# Patient Record
Sex: Female | Born: 1980 | Race: White | Hispanic: No | Marital: Single | Smoking: Former smoker
Health system: Southern US, Community
[De-identification: ages and names within clinical notes are randomized; demographics above are authoritative.]

## PROBLEM LIST (undated history)

## (undated) DIAGNOSIS — G43909 Migraine, unspecified, not intractable, without status migrainosus: Secondary | ICD-10-CM

## (undated) DIAGNOSIS — B009 Herpesviral infection, unspecified: Secondary | ICD-10-CM

## (undated) HISTORY — PX: OTHER SURGICAL HISTORY: SHX169

## (undated) HISTORY — PX: TUBAL LIGATION: SHX77

---

## 2004-05-05 ENCOUNTER — Emergency Department: Payer: Self-pay | Admitting: Internal Medicine

## 2005-11-13 ENCOUNTER — Ambulatory Visit: Payer: Self-pay | Admitting: Obstetrics & Gynecology

## 2005-12-22 ENCOUNTER — Emergency Department: Payer: Self-pay | Admitting: Emergency Medicine

## 2017-08-30 ENCOUNTER — Emergency Department
Admission: EM | Admit: 2017-08-30 | Discharge: 2017-08-30 | Disposition: A | Discharge status: Home / Self Care | Payer: Self-pay | Attending: Emergency Medicine | Admitting: Emergency Medicine

## 2017-08-30 ENCOUNTER — Encounter: Payer: Self-pay | Admitting: Medical Oncology

## 2017-08-30 ENCOUNTER — Emergency Department: Payer: Self-pay

## 2017-08-30 DIAGNOSIS — J069 Acute upper respiratory infection, unspecified: Secondary | ICD-10-CM | POA: Insufficient documentation

## 2017-08-30 LAB — BASIC METABOLIC PANEL
Anion gap: 7 (ref 5–15)
BUN: 15 mg/dL (ref 6–20)
CALCIUM: 8.8 mg/dL — AB (ref 8.9–10.3)
CO2: 25 mmol/L (ref 22–32)
CREATININE: 0.86 mg/dL (ref 0.44–1.00)
Chloride: 103 mmol/L (ref 101–111)
GFR calc non Af Amer: >60 mL/min (ref >60)
GLUCOSE: 98 mg/dL (ref 65–99)
Potassium: 3.3 mmol/L — ABNORMAL LOW (ref 3.5–5.1)
Sodium: 135 mmol/L (ref 135–145)

## 2017-08-30 LAB — CBC WITH DIFFERENTIAL/PLATELET
BASOS ABS: 0 10*3/uL (ref 0–0.1)
BASOS PCT: 1 %
EOS PCT: 1 %
Eosinophils Absolute: 0 10*3/uL (ref 0–0.7)
HCT: 47.5 % — ABNORMAL HIGH (ref 35.0–47.0)
Hemoglobin: 15.9 g/dL (ref 12.0–16.0)
Lymphocytes Relative: 11 %
Lymphs Abs: 0.5 10*3/uL — ABNORMAL LOW (ref 1.0–3.6)
MCH: 30.6 pg (ref 26.0–34.0)
MCHC: 33.5 g/dL (ref 32.0–36.0)
MCV: 91.4 fL (ref 80.0–100.0)
MONO ABS: 0.5 10*3/uL (ref 0.2–0.9)
MONOS PCT: 10 %
Neutro Abs: 3.7 10*3/uL (ref 1.4–6.5)
Neutrophils Relative %: 77 %
PLATELETS: 148 10*3/uL — AB (ref 150–440)
RBC: 5.19 MIL/uL (ref 3.80–5.20)
RDW: 13.1 % (ref 11.5–14.5)
WBC: 4.8 10*3/uL (ref 3.6–11.0)

## 2017-08-30 LAB — LACTIC ACID, PLASMA: Lactic Acid, Venous: 0.8 mmol/L (ref 0.5–1.9)

## 2017-08-30 MED ORDER — ALBUTEROL SULFATE HFA 108 (90 BASE) MCG/ACT IN AERS: 2.0000 | INHALATION_SPRAY | Freq: Four times a day (QID) | RESPIRATORY_TRACT | 0 refills | Status: DC | PRN

## 2017-08-30 MED ORDER — IPRATROPIUM-ALBUTEROL 0.5-2.5 (3) MG/3ML IN SOLN
3.0000 mL | Freq: Once | RESPIRATORY_TRACT | Status: AC
  Administered 2017-08-30: 3 mL via RESPIRATORY_TRACT
  Filled 2017-08-30: qty 3

## 2017-08-30 NOTE — ED Triage Notes (Signed)
First Nurse Note:  States she choked on a piece of ice on Thursday and since that time she has been coughing and running a fever.  Concerned she has pneumonia.    Patient is AAOx3.  Skin warm and dry. No SOB/DOE.  Voice clear and strong.  NAD

## 2017-08-30 NOTE — ED Notes (Signed)
Topez not working 

## 2017-08-30 NOTE — ED Triage Notes (Signed)
Pt reports she has been having fever and coughing since choking on a piece of ice Thursday. Pt concerned she has pneumonia.

## 2017-08-30 NOTE — Discharge Instructions (Addendum)
Please seek medical attention for any high fevers, chest pain, shortness of breath, change in behavior, persistent vomiting, bloody stool or any other new or concerning symptoms.  

## 2017-08-30 NOTE — ED Provider Notes (Signed)
[facility] Emergency Department Provider Note    ____________________________________________   I have reviewed the triage vital signs and the nursing notes.   HISTORY  Chief Complaint Fever and Cough   History limited by: Not Limited   HPI Anne Hendrix is a 37 y.o. female who presents to the emergency department today because of concerns for cough, fever.  Patient states symptoms started 2 days ago.  She thinks it might be related to the previous night when she choked on a piece of ice.  Since 2 days ago she has had progressive cough.  Initially it was of clear phlegm but now it is green. The patient states that she also started developing fever 2 days ago.  She has been taking over-the-counter pain medications which had been adequately controlling the fever although today she did not feel like they performed as well.  She denies any history of chronic lung disease although states she is a smoker.   History reviewed. No pertinent past medical history.  There are no active problems to display for this patient.   History reviewed. No pertinent surgical history.  Prior to Admission medications   Not on File    Allergies Patient has no known allergies.  No family history on file.  Social History Positive for tobacco use  Review of Systems Constitutional: Positive for fevers. Eyes: No visual changes. ENT: No sore throat. Cardiovascular: Positive for chest pain with cough Respiratory: Positive for shortness of breath. Positive for cough. Gastrointestinal: No abdominal pain.  No nausea, no vomiting.  No diarrhea.   Genitourinary: Negative for dysuria. Musculoskeletal: Negative for back pain. Skin: Negative for rash. Neurological: Negative for headaches, focal weakness or numbness.  ____________________________________________   PHYSICAL EXAM:  VITAL SIGNS: ED Triage Vitals  Enc Vitals Group     BP 08/30/17 1711 (!) 151/104     Pulse  Rate 08/30/17 1711 (!) 118     Resp 08/30/17 1711 18     Temp 08/30/17 1711 (!) 101.1 F (38.4 C)     Temp Source 08/30/17 1711 Oral     SpO2 08/30/17 1711 97 %     Weight 08/30/17 1712 160 lb (72.6 kg)     Height 08/30/17 1712 5\' 5"  (1.651 m)     Head Circumference --      Peak Flow --      Pain Score 08/30/17 1712 7  Constitutional: Alert and oriented. Well appearing and in no distress. Eyes: Conjunctivae are normal.  ENT   Head: Normocephalic and atraumatic.   Nose: No congestion/rhinnorhea.   Mouth/Throat: Mucous membranes are moist.   Neck: No stridor. Hematological/Lymphatic/Immunilogical: No cervical lymphadenopathy. Cardiovascular: Tachycardic, regular rhythm.  No murmurs, rubs, or gallops Respiratory: Slightly increased work of breathing. Poor air movement diffusely with some minimal expiratory wheezing. Gastrointestinal: Soft and non tender. No rebound. No guarding.  Genitourinary: Deferred Musculoskeletal: Normal range of motion in all extremities. No lower extremity edema. Neurologic:  Normal speech and language. No gross focal neurologic deficits are appreciated.  Skin:  Skin is warm, dry and intact. No rash noted. Psychiatric: Mood and affect are normal. Speech and behavior are normal. Patient exhibits appropriate insight and judgment.  ____________________________________________    LABS (pertinent positives/negatives)  Lactic acid 0.8 BMP k 3.3, cr 0.86 CBC wbc 4.8, hgb 15.9, plt 148  ____________________________________________   EKG  None  ____________________________________________    RADIOLOGY  CXR No acute disease  ____________________________________________   PROCEDURES  Procedures  ____________________________________________   INITIAL IMPRESSION / ASSESSMENT AND PLAN / ED COURSE  Pertinent labs & imaging results that were available during my care of the patient were reviewed by me and considered in my medical  decision making (see chart for details).  Patient presented to the emergency department today with concern for possible pneumonia after an aspiration event.  Patient was tachycardic and febrile upon initial arrival.  Blood work was checked however patient did not have leukocytosis or lactic acidosis.  X-ray did not show any infiltrate.  At this point I think viral upper respiratory infection most likely.  Patient did feel better after DuoNeb treatment.  Will plan on discharging home with albuterol. Discussed findings and plan with patient.   ____________________________________________   FINAL CLINICAL IMPRESSION(S) / ED DIAGNOSES  Final diagnoses:  Upper respiratory tract infection, unspecified type     Note: This dictation was prepared with Dragon dictation. Any transcriptional errors that result from this process are unintentional     Phineas SemenGoodman, Makiyla Linch, MD 08/30/17 [phone]

## 2018-03-16 ENCOUNTER — Other Ambulatory Visit: Payer: Self-pay

## 2018-03-16 ENCOUNTER — Emergency Department: Payer: Self-pay

## 2018-03-16 DIAGNOSIS — Z79899 Other long term (current) drug therapy: Secondary | ICD-10-CM | POA: Insufficient documentation

## 2018-03-16 DIAGNOSIS — F17210 Nicotine dependence, cigarettes, uncomplicated: Secondary | ICD-10-CM | POA: Insufficient documentation

## 2018-03-16 DIAGNOSIS — I10 Essential (primary) hypertension: Secondary | ICD-10-CM | POA: Insufficient documentation

## 2018-03-16 DIAGNOSIS — G43801 Other migraine, not intractable, with status migrainosus: Secondary | ICD-10-CM | POA: Insufficient documentation

## 2018-03-16 LAB — DIFFERENTIAL
ABS IMMATURE GRANULOCYTES: 0.05 10*3/uL (ref 0.00–0.07)
Basophils Absolute: 0.1 10*3/uL (ref 0.0–0.1)
Basophils Relative: 1 %
Eosinophils Absolute: 0.2 10*3/uL (ref 0.0–0.5)
Eosinophils Relative: 2 %
Immature Granulocytes: 1 %
LYMPHS ABS: 2.9 10*3/uL (ref 0.7–4.0)
Lymphocytes Relative: 31 %
MONO ABS: 0.5 10*3/uL (ref 0.1–1.0)
MONOS PCT: 6 %
Neutro Abs: 5.8 10*3/uL (ref 1.7–7.7)
Neutrophils Relative %: 59 %

## 2018-03-16 LAB — COMPREHENSIVE METABOLIC PANEL
ALK PHOS: 62 U/L (ref 38–126)
ALT: 14 U/L (ref 0–44)
AST: 14 U/L — AB (ref 15–41)
Albumin: 4.4 g/dL (ref 3.5–5.0)
Anion gap: 8 (ref 5–15)
BILIRUBIN TOTAL: 0.5 mg/dL (ref 0.3–1.2)
BUN: 13 mg/dL (ref 6–20)
CALCIUM: 9.1 mg/dL (ref 8.9–10.3)
CO2: 25 mmol/L (ref 22–32)
CREATININE: 0.66 mg/dL (ref 0.44–1.00)
Chloride: 107 mmol/L (ref 98–111)
GFR calc Af Amer: >60 mL/min (ref >60)
Glucose, Bld: 102 mg/dL — ABNORMAL HIGH (ref 70–99)
Potassium: 3.8 mmol/L (ref 3.5–5.1)
Sodium: 140 mmol/L (ref 135–145)
TOTAL PROTEIN: 7.5 g/dL (ref 6.5–8.1)

## 2018-03-16 LAB — CBC
HCT: 46.6 % — ABNORMAL HIGH (ref 36.0–46.0)
HEMOGLOBIN: 15.9 g/dL — AB (ref 12.0–15.0)
MCH: 31.4 pg (ref 26.0–34.0)
MCHC: 34.1 g/dL (ref 30.0–36.0)
MCV: 92.1 fL (ref 80.0–100.0)
Platelets: 265 10*3/uL (ref 150–400)
RBC: 5.06 MIL/uL (ref 3.87–5.11)
RDW: 12.1 % (ref 11.5–15.5)
WBC: 9.5 10*3/uL (ref 4.0–10.5)
nRBC: 0.2 % (ref 0.0–0.2)

## 2018-03-16 LAB — TROPONIN I

## 2018-03-16 NOTE — ED Triage Notes (Signed)
Pt states Saturday had a yellow streak cut across vision with a HA. States L eye vision was blurred yesterday but went away. Also HA yesterday. Today states "reflective" vision. BP was 190/145, 180/130 yesterday. No Hx of HTN per pt. Denies double vision. States increased stress last week.    Speech clear. A&O, ambulatory. Moving all extremities. No weakness noted.

## 2018-03-17 ENCOUNTER — Emergency Department
Admission: EM | Admit: 2018-03-17 | Discharge: 2018-03-17 | Disposition: A | Discharge status: Home / Self Care | Payer: Self-pay | Attending: Emergency Medicine | Admitting: Emergency Medicine

## 2018-03-17 DIAGNOSIS — G43101 Migraine with aura, not intractable, with status migrainosus: Secondary | ICD-10-CM

## 2018-03-17 DIAGNOSIS — I10 Essential (primary) hypertension: Secondary | ICD-10-CM

## 2018-03-17 DIAGNOSIS — G43901 Migraine, unspecified, not intractable, with status migrainosus: Secondary | ICD-10-CM

## 2018-03-17 HISTORY — DX: Herpesviral infection, unspecified: B00.9

## 2018-03-17 HISTORY — DX: Migraine, unspecified, not intractable, without status migrainosus: G43.909

## 2018-03-17 MED ORDER — DIPHENHYDRAMINE HCL 50 MG/ML IJ SOLN
12.5000 mg | INTRAMUSCULAR | Status: AC
  Administered 2018-03-17: 12.5 mg via INTRAVENOUS
  Filled 2018-03-17: qty 1

## 2018-03-17 MED ORDER — KETOROLAC TROMETHAMINE 30 MG/ML IJ SOLN
15.0000 mg | Freq: Once | INTRAMUSCULAR | Status: AC
  Administered 2018-03-17: 15 mg via INTRAVENOUS
  Filled 2018-03-17: qty 1

## 2018-03-17 MED ORDER — DEXAMETHASONE SODIUM PHOSPHATE 10 MG/ML IJ SOLN
10.0000 mg | Freq: Once | INTRAMUSCULAR | Status: AC
  Administered 2018-03-17: 10 mg via INTRAVENOUS
  Filled 2018-03-17: qty 1

## 2018-03-17 MED ORDER — AMLODIPINE BESYLATE 5 MG PO TABS: 5.0000 mg | ORAL_TABLET | Freq: Every day | ORAL | 2 refills | Status: DC

## 2018-03-17 MED ORDER — AMLODIPINE BESYLATE 5 MG PO TABS
5.0000 mg | ORAL_TABLET | Freq: Once | ORAL | Status: AC
  Administered 2018-03-17: 5 mg via ORAL
  Filled 2018-03-17: qty 1

## 2018-03-17 MED ORDER — ACETAMINOPHEN 500 MG PO TABS
1000.0000 mg | ORAL_TABLET | Freq: Once | ORAL | Status: AC
Start: 2018-03-17 — End: 2018-03-17
  Administered 2018-03-17: 1000 mg via ORAL
  Filled 2018-03-17: qty 2

## 2018-03-17 MED ORDER — METOCLOPRAMIDE HCL 5 MG/ML IJ SOLN
10.0000 mg | INTRAMUSCULAR | Status: AC
  Administered 2018-03-17: 10 mg via INTRAVENOUS
  Filled 2018-03-17: qty 2

## 2018-03-17 NOTE — ED Provider Notes (Signed)
[facility] Emergency Department Provider Note  ____________________________________________   First MD Initiated Contact with Patient 03/17/18 0220     (approximate)  I have reviewed the triage vital signs and the nursing notes.   HISTORY  Chief Complaint Blurred Vision    HPI Anne Hendrix is a 37 y.o. female with a medical history that includes migraines who presents for evaluation of visual disturbance, headache, and hypertension.  She reports that she has never been told she has high blood pressure in the past but when she checked her blood pressure recently it was considerably elevated at more than 200 systolic.  Additionally she reports that about 3 days ago she first developed some pain in her left eye associated with a headache.  The pain in the eyes mostly resolved but the headache is persisted for the last 3 days and now she sees a funny light in the left eye as if someone is shining a light at her from the side.  Her vision is not affected and she denies having any double vision.  She has had no numbness nor weakness in any of her extremities, no difficulty with ambulation or with fine motor function.  She denies fever/chills, neck pain, chest pain, shortness of breath, vomiting, and abdominal pain.  She has had some nausea associated with the severe headache but not currently.  Overall nothing in particular makes her symptoms better or worse.  She has no regular doctor and takes no blood pressure medicine.  She has had no recent respiratory illnesses including no signs of sinus infection.   Of note she also says she has been under an extreme amount of stress due to some social and family issues lately and wonders if that could have triggered the migraine.  Past Medical History:  Diagnosis Date  . Herpes   . Migraine     There are no active problems to display for this patient.   Past Surgical History:  Procedure Laterality Date  . TUBAL  LIGATION    . uterine ablation      Prior to Admission medications   Medication Sig Start Date End Date Taking? Authorizing Provider  albuterol (PROVENTIL HFA;VENTOLIN HFA) 108 (90 Base) MCG/ACT inhaler Inhale 2 puffs into the lungs every 6 (six) hours as needed for wheezing or shortness of breath. 08/30/17   Phineas Semen, MD  amLODipine (NORVASC) 5 MG tablet Take 1 tablet (5 mg total) by mouth daily. 03/17/18 03/17/19  Loleta Rose, MD    Allergies Patient has no known allergies.  History reviewed. No pertinent family history.  Social History Social History   Tobacco Use  . Smoking status: Current Every Day Smoker  Substance Use Topics  . Alcohol use: Yes  . Drug use: Yes    Types: Marijuana    Review of Systems Constitutional: No fever/chills Eyes: Initially some blurry vision in the left eye which is now a bright light in her left eye associated with headache ENT: No sore throat. Cardiovascular: Denies chest pain. Respiratory: Denies shortness of breath. Gastrointestinal: No abdominal pain.  Nausea associated with severe headache, no vomiting.  No diarrhea.  No constipation. Genitourinary: Negative for dysuria. Musculoskeletal: Negative for neck pain.  Negative for back pain. Integumentary: Negative for rash. Neurological: Generalized headache for 3 days with associated left eye visual changes as documented above.   ____________________________________________   PHYSICAL EXAM:  VITAL SIGNS: ED Triage Vitals  Enc Vitals Group     BP 03/16/18 2138 Marland Kitchen)  202/160     Pulse Rate 03/16/18 2138 97     Resp 03/16/18 2138 19     Temp 03/16/18 2138 99.2 F (37.3 C)     Temp Source 03/16/18 2138 Oral     SpO2 03/16/18 2138 97 %     Weight 03/16/18 2143 66.2 kg (146 lb)     Height 03/16/18 2143 1.651 m (5\' 5" )     Head Circumference --      Peak Flow --      Pain Score 03/16/18 2143 8     Pain Loc --      Pain Edu? --      Excl. in GC? --     Constitutional:  Alert and oriented. Well appearing and in no acute distress. Eyes: Conjunctivae are normal. PERRL. EOMI. no nystagmus. Head: Atraumatic. Nose: No congestion/rhinnorhea. Mouth/Throat: Mucous membranes are moist. Neck: No stridor.  No meningeal signs.   Cardiovascular: Normal rate, regular rhythm. Good peripheral circulation. Grossly normal heart sounds. Respiratory: Normal respiratory effort.  No retractions. Lungs CTAB. Gastrointestinal: Soft and nontender. No distention.  Musculoskeletal: No lower extremity tenderness nor edema. No gross deformities of extremities. Neurologic:  Normal speech and language. No gross focal neurologic deficits are appreciated.  No dysmetria nor dysdiadochokinesia.  Skin:  Skin is warm, dry and intact. No rash noted.   ____________________________________________   LABS (all labs ordered are listed, but only abnormal results are displayed)  Labs Reviewed  CBC - Abnormal; Notable for the following components:      Result Value   Hemoglobin 15.9 (*)    HCT 46.6 (*)    All other components within normal limits  COMPREHENSIVE METABOLIC PANEL - Abnormal; Notable for the following components:   Glucose, Bld 102 (*)    AST 14 (*)    All other components within normal limits  DIFFERENTIAL  TROPONIN I  CBG MONITORING, ED  POC URINE PREG, ED   ____________________________________________  EKG  ED ECG REPORT I, Loleta Rose, the attending physician, personally viewed and interpreted this ECG.  Date: 03/16/2018 EKG Time: 21: 46 Rate: 103 Rhythm: Borderline sinus tachycardia QRS Axis: normal Intervals: Left anterior fascicular block ST/T Wave abnormalities: Non-specific ST segment / T-wave changes, but no evidence of acute ischemia. Narrative Interpretation: no evidence of acute ischemia   ____________________________________________  RADIOLOGY   ED MD interpretation: No evidence of acute intracranial abnormalities  Official radiology  report(s): Ct Head Wo Contrast  Result Date: 03/16/2018 CLINICAL DATA:  Headache vision loss EXAM: CT HEAD WITHOUT CONTRAST TECHNIQUE: Contiguous axial images were obtained from the base of the skull through the vertex without intravenous contrast. COMPARISON:  None. FINDINGS: Brain: No evidence of acute infarction, hemorrhage, hydrocephalus, extra-axial collection or mass lesion/mass effect. Vascular: No hyperdense vessel or unexpected calcification. Skull: Normal. Negative for fracture or focal lesion. Sinuses/Orbits: No acute finding. Other: None IMPRESSION: Negative non contrasted CT appearance of the brain Electronically Signed   By: Jasmine Pang M.D.   On: 03/16/2018 22:00    ____________________________________________   PROCEDURES  Critical Care performed: No   Procedure(s) performed:   Procedures   ____________________________________________   INITIAL IMPRESSION / ASSESSMENT AND PLAN / ED COURSE  As part of my medical decision making, I reviewed the following data within the electronic MEDICAL RECORD NUMBER Nursing notes reviewed and incorporated, Labs reviewed , Old chart reviewed and Notes from prior ED visits    Differential diagnosis includes, but is not limited to, complicated migraine,  Malignant hypertension, MS, neurosarcoidosis, other nonspecific optic neuritis.  Also less likely to be a sinus thrombosis.  The patient is well-appearing in spite of her symptoms and pressure.  I suspect the hypertension is mostly chronic.  Her vital signs are reassuring otherwise and her lab work is all reassuring with no evidence of endorgan dysfunction or acute infection.  No evidence of ischemia on EKG.  She has no focal neurological deficits and only has the global headache and some visual disturbances in the left eye which is consistent with prior migraines.  Head CT without contrast is unremarkable.  After a discussion about whether or not the chronic blood pressure is playing a  role, we decided to treat with amlodipine 5 mg by mouth for gentle blood pressure control as well as my usual migraine cocktail which includes the following: Metoclopramide 10 mg IV, Benadryl 12.5 mg IV, Toradol 15 mg IV, Tylenol 1000 mg by mouth, and Decadron 10 mg IV.  I reassessed an hour or so later and the patient says she feels much better.  The visual disturbances are completely gone and the headache is almost completely gone.  I offered to try additional medication such as magnesium or Haldol for further migraine improvement but she declines and says she feels well enough she would like to go home.  I encourage close outpatient follow-up and will prescribe amlodipine 5 mg by mouth for her to start on blood pressure control but stressed to her she needs to establish a primary care doctor.  She states that she understands and agrees.     ____________________________________________  FINAL CLINICAL IMPRESSION(S) / ED DIAGNOSES  Final diagnoses:  Complicated migraine with status migrainosus  Essential hypertension     MEDICATIONS GIVEN DURING THIS VISIT:  Medications  metoCLOPramide (REGLAN) injection 10 mg (10 mg Intravenous Given 03/17/18 0319)  diphenhydrAMINE (BENADRYL) injection 12.5 mg (12.5 mg Intravenous Given 03/17/18 0318)  ketorolac (TORADOL) 30 MG/ML injection 15 mg (15 mg Intravenous Given 03/17/18 0319)  dexamethasone (DECADRON) injection 10 mg (10 mg Intravenous Given 03/17/18 0319)  acetaminophen (TYLENOL) tablet 1,000 mg (1,000 mg Oral Given 03/17/18 0320)  amLODipine (NORVASC) tablet 5 mg (5 mg Oral Given 03/17/18 0320)     ED Discharge Orders         Ordered    amLODipine (NORVASC) 5 MG tablet  Daily     03/17/18 0452           Note:  This document was prepared using Dragon voice recognition software and may include unintentional dictation errors.    Loleta Rose, MD 03/17/18 [phone]

## 2018-03-17 NOTE — Discharge Instructions (Addendum)
You have been seen in the Emergency Department (ED) for a migraine.  Please use Tylenol or Motrin as needed for symptoms, but only as written on the box, and take any regular medications that have been prescribed for you. As we have discussed, please follow up with your doctor as soon as possible regarding todays ED visit and your headache symptoms.   We also started you on blood pressure medication and it is important you follow up with a regular doctor to continue managing both your hypertension and your migraines.  Call your doctor or return to the Emergency Department (ED) if you have a worsening headache, sudden and severe headache, confusion, slurred speech, facial droop, weakness or numbness in any arm or leg, extreme fatigue, or other symptoms that concern you.

## 2019-08-12 ENCOUNTER — Ambulatory Visit: Payer: Self-pay | Attending: Internal Medicine

## 2019-08-12 DIAGNOSIS — Z23 Encounter for immunization: Secondary | ICD-10-CM

## 2019-08-12 NOTE — Progress Notes (Signed)
   Covid-19 Vaccination Clinic  Name:  Anne Hendrix    MRN: [id number] DOB: 02/13/1981  08/12/2019  Anne Hendrix was observed post Covid-19 immunization for 15 minutes without incident. She was provided with Vaccine Information Sheet and instruction to access the V-Safe system.   Anne Hendrix was instructed to call 911 with any severe reactions post vaccine: Marland Kitchen Difficulty breathing  . Swelling of face and throat  . A fast heartbeat  . A bad rash all over body  . Dizziness and weakness   Immunizations Administered    Name Date Dose VIS Date Route   Moderna COVID-19 Vaccine 08/12/2019  8:47 AM 0.5 mL 04/26/2019 Intramuscular   Manufacturer: Moderna   Lot: [id number]   NDC: 41937-902-40

## 2019-09-13 ENCOUNTER — Ambulatory Visit: Payer: Self-pay | Attending: Internal Medicine

## 2019-09-13 DIAGNOSIS — Z23 Encounter for immunization: Secondary | ICD-10-CM

## 2019-09-13 NOTE — Progress Notes (Signed)
   Covid-19 Vaccination Clinic  Name:  Anne Hendrix    MRN: [id number] DOB: [birth year]  09/13/2019  Anne Hendrix was observed post Covid-19 immunization for 15 minutes without incident. She was provided with Vaccine Information Sheet and instruction to access the V-Safe system.   Anne Hendrix was instructed to call 911 with any severe reactions post vaccine: Marland Kitchen Difficulty breathing  . Swelling of face and throat  . A fast heartbeat  . A bad rash all over body  . Dizziness and weakness   Immunizations Administered    Name Date Dose VIS Date Route   Moderna COVID-19 Vaccine 09/13/2019  8:33 AM 0.5 mL 04/2019 Intramuscular   Manufacturer: Moderna   Lot: [id number]   NDC: 08811-031-59

## 2019-10-30 IMAGING — CT CT HEAD W/O CM
3 of 4 series · 15 of 47 positions shown, 18 images · non-contrast
Comparison: None.

CLINICAL DATA: Headache vision loss

EXAM:
CT HEAD WITHOUT CONTRAST
TECHNIQUE: Contiguous axial images were obtained from the base of the skull
through the vertex without intravenous contrast.

[Series 2: head wo · axial · 0.40mm/px · z∈[-141,-21]mm · 9 of 28 slices shown, 12 images]
[im 2/28  brain]
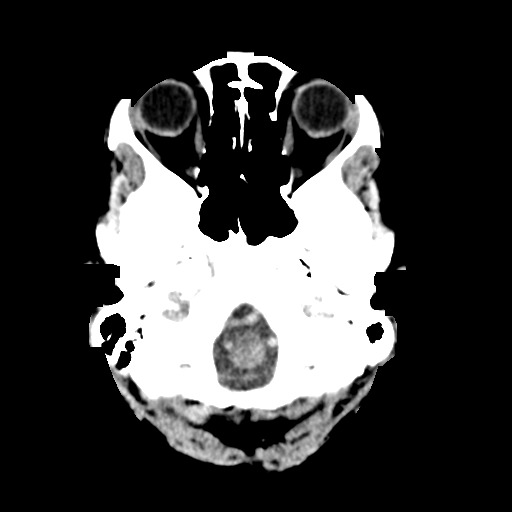
[im 2/28  bone]
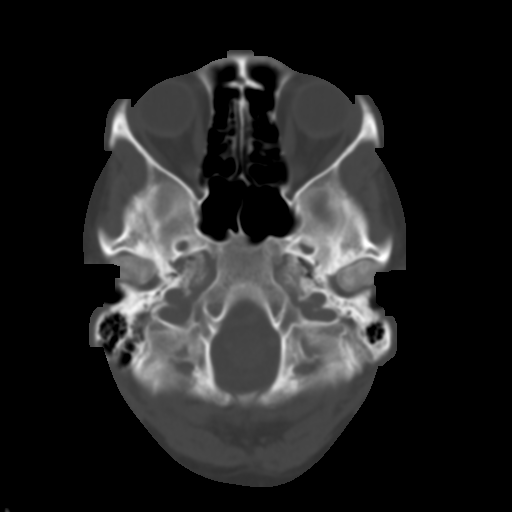
[im 6/28  brain]
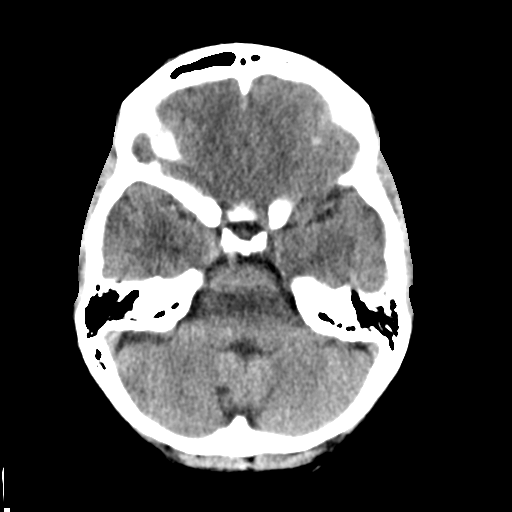
[im 8/28  brain]
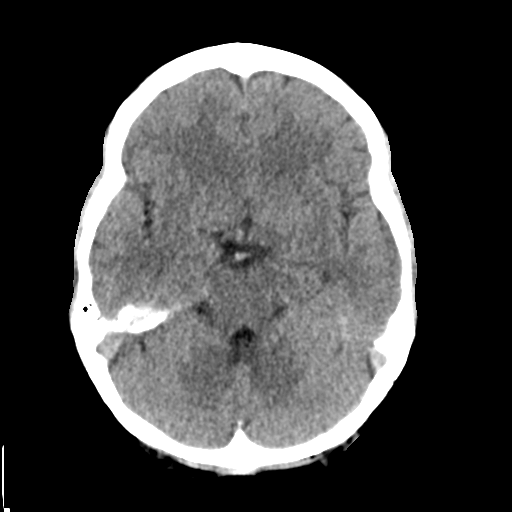
[im 12/28  brain]
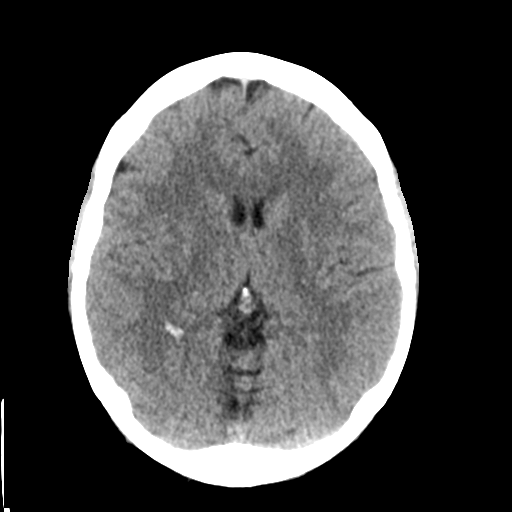
[im 14/28  brain]
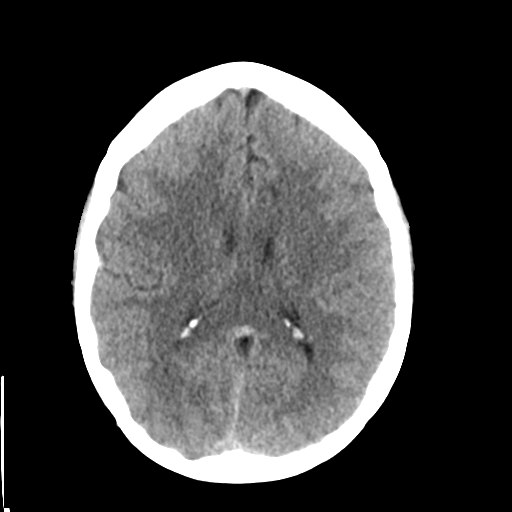
[im 14/28  bone]
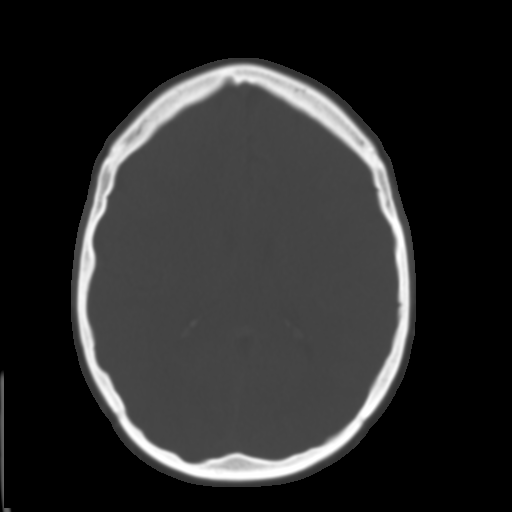
[im 16/28  brain]
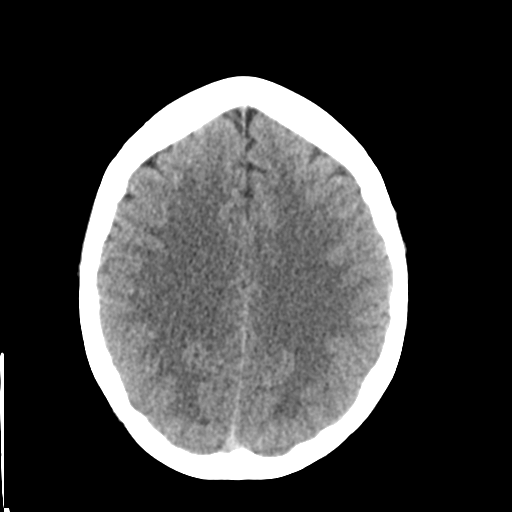
[im 20/28  brain]
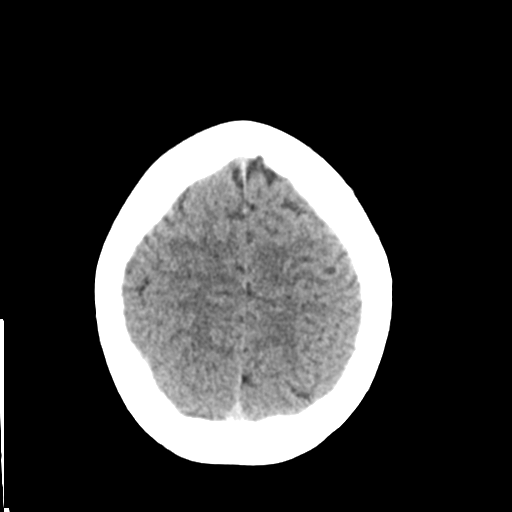
[im 22/28  brain]
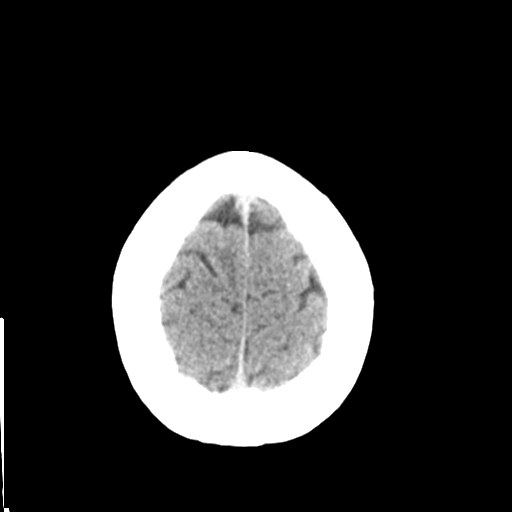
[im 26/28  brain]
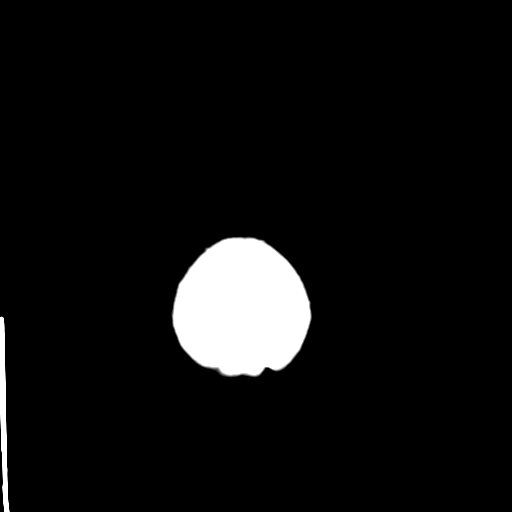
[im 26/28  bone]
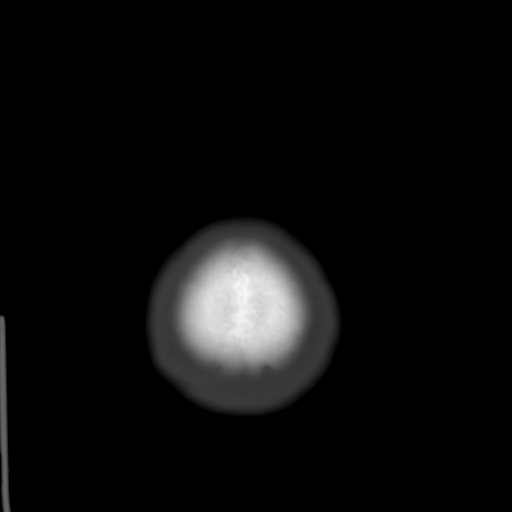

[Series 4: coronal soft tissue · coronal · 0.28mm/px · 3 of 59 slices shown]
[im 20/59  brain]
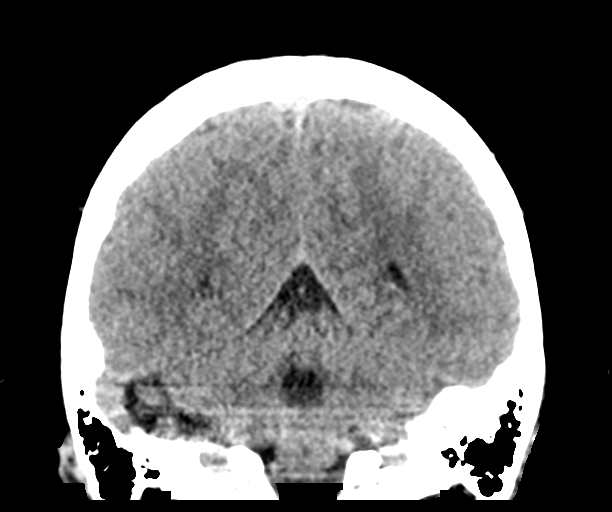
[im 26/59  brain]
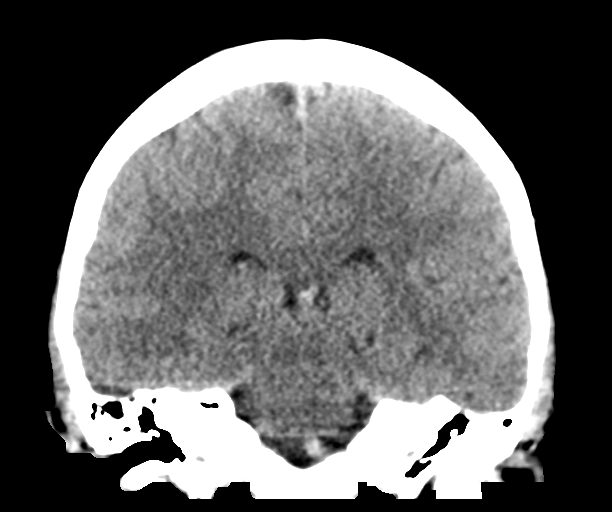
[im 33/59  brain]
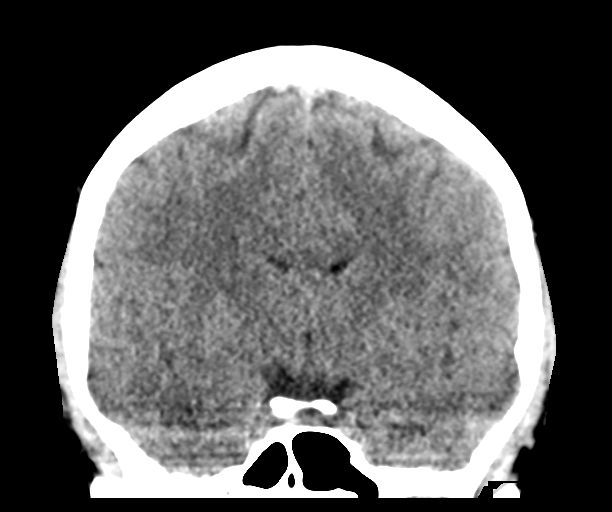

[Series 5: sagittal soft tissue · sagittal · 0.29mm/px · 3 of 49 slices shown]
[im 17/49  brain]
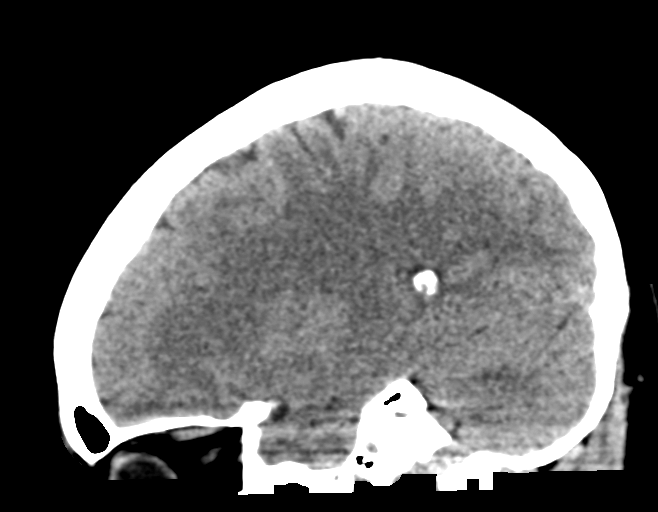
[im 25/49  brain]
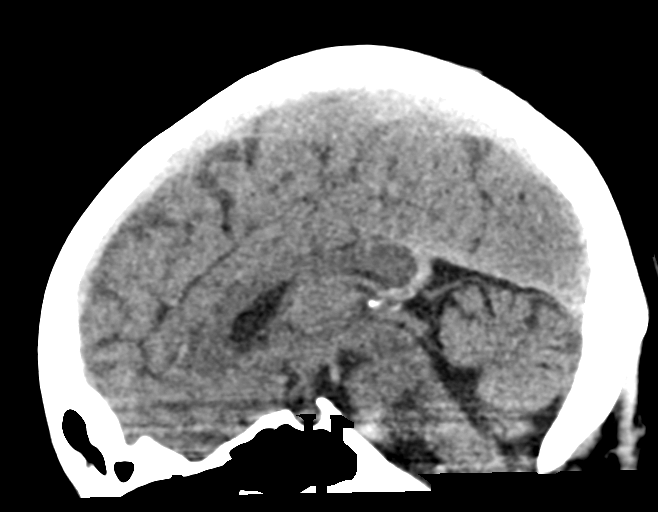
[im 33/49  brain]
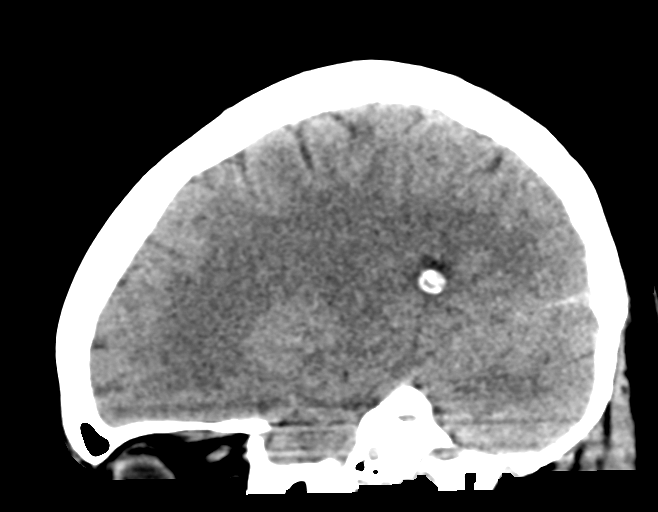

[15 of 47 positions shown; findings below may reference images not displayed]

FINDINGS: Brain: No evidence of acute infarction, hemorrhage, hydrocephalus,
extra-axial collection or mass lesion/mass effect.

Vascular: No hyperdense vessel or unexpected calcification.

Skull: Normal. Negative for fracture or focal lesion.

Sinuses/Orbits: No acute finding.

Other: None
IMPRESSION: Negative non contrasted CT appearance of the brain

## 2020-07-25 ENCOUNTER — Encounter (INDEPENDENT_AMBULATORY_CARE_PROVIDER_SITE_OTHER): Payer: Self-pay

## 2020-07-25 ENCOUNTER — Other Ambulatory Visit: Payer: Self-pay

## 2020-07-25 ENCOUNTER — Ambulatory Visit: Payer: Self-pay | Attending: Oncology | Admitting: *Deleted

## 2020-07-25 ENCOUNTER — Encounter: Payer: Self-pay | Admitting: *Deleted

## 2020-07-25 ENCOUNTER — Ambulatory Visit
Admission: RE | Admit: 2020-07-25 | Discharge: 2020-07-25 | Disposition: A | Discharge status: Home / Self Care | Payer: Self-pay | Source: Ambulatory Visit | Attending: Oncology | Admitting: Oncology

## 2020-07-25 VITALS — BP 133/93 | HR 88 | Temp 98.8°F | Ht 66.14 in | Wt 122.3 lb

## 2020-07-25 DIAGNOSIS — Z0000 Encounter for general adult medical examination without abnormal findings: Secondary | ICD-10-CM | POA: Insufficient documentation

## 2020-07-25 NOTE — Patient Instructions (Signed)
Gave patient hand-out, Women Staying Healthy, Active and Well from [facility], with education on breast health, pap smears, heart and colon health. 

## 2020-07-25 NOTE — Progress Notes (Signed)
Subjective:     Patient ID: Anne Hendrix, female   DOB: 1980/08/28, 40 y.o.   MRN: [id number]  HPI   [facility] Medical History Record - 07/25/20 0946      Breast History   Screening cycle New    Provider (CBE) [facility]    Last Mammogram Never    Recent Breast Symptoms Pain   bilateral tender Axillary lymph nodes     Breast Cancer History   Comments/Details p-gm 30's; p-ggm 50's p-aunt 40's all breast cancer      Previous History of Breast Problems   Breast Surgery or Biopsy None    Breast Implants N/A    BSE Done Monthly      Gynecological/Obstetrical History   LMP 07/03/20    Is there any chance that the client could be pregnant?  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     Left: No swelling, bleeding, inverted nipple, mass, nipple discharge, skin change, tenderness, axillary adenopathy or supraclavicular adenopathy.    Lymphadenopathy:     Upper Body:     Right upper body: No supraclavicular or axillary adenopathy.     Left upper body: No supraclavicular or axillary adenopathy.        Assessment:     40 year old female referred to  [facility] by [facility] for further evaluation of bilateral axillary tender lymph nodes.  Patient states she has "lymph nodes" in both axilla that enlarge and get tender prior to her menstrual cycle and then go away.  States she also gets some enlarged lymph nodes in her neck, but is not sure if it's associated with her menstrual cycle.  States she does not have any enlarged nodes today.  On clinical breast exam there is no dominant mass, skin changes, nipple discharge or lymphadenopathy. Reviewed anatomy and possibility of breast tissue in the tail of spence that is responding to cyclic changes.  Patient does have a family history of breast cancer in PGM at age 85, PgreatGM in her 73's, and a paternal aunt with breast cancer in her 72's.  She does not think anyone has had genetic testing.  She is going to ask her aunt.  Taught self breast awareness.  Patient is due for her pap.  Advised to go back to [facility] for her pap and if any abnormality to call me back and we can refer her to  gyn, or she can wait and call me back in September when she turns 40 and can get her in [facility] for her pap at that time.  Patient has been screened for eligibility.  She does not have any insurance, Medicare or Medicaid.  She also meets financial eligibility.   Risk Assessment    Risk Scores      07/25/2020   Last edited by: Scarlett Presto, RN   5-year risk: 0.4 %   Lifetime risk: 8.1 %        .     Plan:     Baseline screening mammogram ordered.  My office number given to patient to follow up for next pap.  Will follow up per [facility] protocol.

## 2020-07-30 ENCOUNTER — Other Ambulatory Visit: Payer: Self-pay | Admitting: *Deleted

## 2020-07-30 DIAGNOSIS — R920 Mammographic microcalcification found on diagnostic imaging of breast: Secondary | ICD-10-CM

## 2020-08-06 ENCOUNTER — Ambulatory Visit
Admission: RE | Admit: 2020-08-06 | Discharge: 2020-08-06 | Disposition: A | Discharge status: Home / Self Care | Payer: Self-pay | Source: Ambulatory Visit | Attending: Oncology | Admitting: Oncology

## 2020-08-06 ENCOUNTER — Other Ambulatory Visit: Payer: Self-pay

## 2020-08-06 DIAGNOSIS — R920 Mammographic microcalcification found on diagnostic imaging of breast: Secondary | ICD-10-CM | POA: Insufficient documentation

## 2020-08-10 ENCOUNTER — Encounter: Payer: Self-pay | Admitting: *Deleted

## 2020-08-10 NOTE — Progress Notes (Signed)
Letter mailed from [facility] to inform patient of her normal mammogram results.  Patient is to follow-up with annual screening in one year. 

## 2021-03-05 ENCOUNTER — Emergency Department
Admission: EM | Admit: 2021-03-05 | Discharge: 2021-03-05 | Disposition: A | Discharge status: Home / Self Care | Payer: Self-pay | Attending: Emergency Medicine | Admitting: Emergency Medicine

## 2021-03-05 ENCOUNTER — Encounter: Payer: Self-pay | Admitting: Emergency Medicine

## 2021-03-05 ENCOUNTER — Emergency Department: Payer: Self-pay

## 2021-03-05 ENCOUNTER — Other Ambulatory Visit: Payer: Self-pay

## 2021-03-05 DIAGNOSIS — T59811A Toxic effect of smoke, accidental (unintentional), initial encounter: Secondary | ICD-10-CM

## 2021-03-05 DIAGNOSIS — X58XXXA Exposure to other specified factors, initial encounter: Secondary | ICD-10-CM | POA: Insufficient documentation

## 2021-03-05 DIAGNOSIS — F17200 Nicotine dependence, unspecified, uncomplicated: Secondary | ICD-10-CM | POA: Insufficient documentation

## 2021-03-05 DIAGNOSIS — Z7729 Contact with and (suspected ) exposure to other hazardous substances: Secondary | ICD-10-CM

## 2021-03-05 DIAGNOSIS — T5891XA Toxic effect of carbon monoxide from unspecified source, accidental (unintentional), initial encounter: Secondary | ICD-10-CM | POA: Insufficient documentation

## 2021-03-05 DIAGNOSIS — R519 Headache, unspecified: Secondary | ICD-10-CM | POA: Insufficient documentation

## 2021-03-05 MED ORDER — LISINOPRIL 10 MG PO TABS
10.0000 mg | ORAL_TABLET | Freq: Once | ORAL | Status: AC
  Administered 2021-03-05: 10 mg via ORAL
  Filled 2021-03-05: qty 1

## 2021-03-05 MED ORDER — HYDROCODONE-ACETAMINOPHEN 5-325 MG PO TABS
2.0000 | ORAL_TABLET | Freq: Once | ORAL | Status: AC
  Administered 2021-03-05: 2 via ORAL
  Filled 2021-03-05: qty 2

## 2021-03-05 NOTE — ED Notes (Addendum)
Non-rebreather placed on pt at 15L
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
## 2021-03-05 NOTE — ED Notes (Signed)
Lab contacted to collect vbg

## 2021-03-05 NOTE — ED Notes (Signed)
Report received from Heather, RN.

## 2021-04-29 LAB — COOXEMETRY PANEL
Carboxyhemoglobin: 6.8 % (ref 0.5–1.5)
Methemoglobin: 1 % (ref 0.0–1.5)

## 2021-09-15 ENCOUNTER — Other Ambulatory Visit: Payer: Self-pay

## 2021-09-15 ENCOUNTER — Emergency Department
Admission: EM | Admit: 2021-09-15 | Discharge: 2021-09-15 | Discharge status: Against Medical Advice | Payer: Self-pay | Attending: Emergency Medicine | Admitting: Emergency Medicine

## 2021-09-15 DIAGNOSIS — F17200 Nicotine dependence, unspecified, uncomplicated: Secondary | ICD-10-CM | POA: Insufficient documentation

## 2021-09-15 DIAGNOSIS — F419 Anxiety disorder, unspecified: Secondary | ICD-10-CM | POA: Insufficient documentation

## 2021-09-15 DIAGNOSIS — R451 Restlessness and agitation: Secondary | ICD-10-CM

## 2021-09-15 DIAGNOSIS — F3110 Bipolar disorder, current episode manic without psychotic features, unspecified: Secondary | ICD-10-CM | POA: Insufficient documentation

## 2021-09-15 DIAGNOSIS — R825 Elevated urine levels of drugs, medicaments and biological substances: Secondary | ICD-10-CM | POA: Insufficient documentation

## 2021-09-15 DIAGNOSIS — F3112 Bipolar disorder, current episode manic without psychotic features, moderate: Secondary | ICD-10-CM | POA: Diagnosis present

## 2021-09-15 LAB — URINE DRUG SCREEN, QUALITATIVE (ARMC ONLY)
Amphetamines, Ur Screen: NOT DETECTED
Barbiturates, Ur Screen: NOT DETECTED
Benzodiazepine, Ur Scrn: NOT DETECTED
Cannabinoid 50 Ng, Ur ~~LOC~~: POSITIVE — AB
Cocaine Metabolite,Ur ~~LOC~~: NOT DETECTED
MDMA (Ecstasy)Ur Screen: NOT DETECTED
Methadone Scn, Ur: NOT DETECTED
Opiate, Ur Screen: NOT DETECTED
Phencyclidine (PCP) Ur S: NOT DETECTED
Tricyclic, Ur Screen: NOT DETECTED

## 2021-09-15 LAB — COMPREHENSIVE METABOLIC PANEL
ALT: 13 U/L (ref 0–44)
AST: 15 U/L (ref 15–41)
Albumin: 4 g/dL (ref 3.5–5.0)
Alkaline Phosphatase: 50 U/L (ref 38–126)
Anion gap: 5 (ref 5–15)
BUN: 17 mg/dL (ref 6–20)
CO2: 27 mmol/L (ref 22–32)
Calcium: 8.9 mg/dL (ref 8.9–10.3)
Chloride: 106 mmol/L (ref 98–111)
Creatinine, Ser: 0.63 mg/dL (ref 0.44–1.00)
GFR, Estimated: >60 mL/min (ref >60)
Glucose, Bld: 111 mg/dL — ABNORMAL HIGH (ref 70–99)
Potassium: 3.8 mmol/L (ref 3.5–5.1)
Sodium: 138 mmol/L (ref 135–145)
Total Bilirubin: 0.4 mg/dL (ref 0.3–1.2)
Total Protein: 6.9 g/dL (ref 6.5–8.1)

## 2021-09-15 LAB — CBC
HCT: 42.6 % (ref 36.0–46.0)
Hemoglobin: 14 g/dL (ref 12.0–15.0)
MCH: 30.6 pg (ref 26.0–34.0)
MCHC: 32.9 g/dL (ref 30.0–36.0)
MCV: 93.2 fL (ref 80.0–100.0)
Platelets: 265 10*3/uL (ref 150–400)
RBC: 4.57 MIL/uL (ref 3.87–5.11)
RDW: 12.4 % (ref 11.5–15.5)
WBC: 7.3 10*3/uL (ref 4.0–10.5)
nRBC: 0 % (ref 0.0–0.2)

## 2021-09-15 LAB — ACETAMINOPHEN LEVEL: Acetaminophen (Tylenol), Serum: <10 ug/mL — ABNORMAL LOW (ref 10–30)

## 2021-09-15 LAB — SALICYLATE LEVEL: Salicylate Lvl: <7 mg/dL — ABNORMAL LOW (ref 7.0–30.0)

## 2021-09-15 LAB — ETHANOL: Alcohol, Ethyl (B): <10 mg/dL (ref <10)

## 2021-09-15 MED ORDER — HYDROXYZINE HCL 25 MG PO TABS: 25.0000 mg | ORAL_TABLET | Freq: Three times a day (TID) | ORAL | Status: DC | PRN

## 2021-09-15 MED ORDER — OLANZAPINE 5 MG PO TABS: 5.0000 mg | ORAL_TABLET | Freq: Every day | ORAL | Status: DC

## 2021-09-15 MED ORDER — ESCITALOPRAM OXALATE 10 MG PO TABS: 5.0000 mg | ORAL_TABLET | Freq: Every day | ORAL | Status: DC

## 2021-09-15 MED ORDER — TRAZODONE HCL 100 MG PO TABS: 100.0000 mg | ORAL_TABLET | Freq: Every day | ORAL | Status: DC

## 2021-09-15 MED ORDER — OLANZAPINE 10 MG PO TABS: 10.0000 mg | ORAL_TABLET | Freq: Every day | ORAL | Status: DC

## 2021-09-15 MED ORDER — LISINOPRIL 10 MG PO TABS: 10.0000 mg | ORAL_TABLET | Freq: Every day | ORAL | Status: DC
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
## 2021-09-15 NOTE — ED Triage Notes (Addendum)
Pt daughter reports her mother does not want her in the room with her but if there are any questions that she cannot answer she can. Marinda Elk [phone]. Pts husband [name] is primary caregiver [phone] ?
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
## 2021-09-15 NOTE — ED Notes (Signed)
Patient states she wants to leave. MD notified

## 2021-09-15 NOTE — Discharge Instructions (Addendum)
We recommended that you be admitted but you refused ? ?

## 2021-09-15 NOTE — ED Notes (Addendum)
Pt dressed out into hospital attire. Pt's belongings to include: ?1 brown sweater ?1 gray stripe pants ?2 gray boots ?1 blue nightgown ?1 blue brown bag filled with clothes ?1 black vape ?2 gray socks ?1 pink underwear ?1 pack newports ?1 black lighter ?1 black phone ? ?

## 2021-09-15 NOTE — ED Notes (Signed)
Vol pending consult 

## 2021-09-15 NOTE — Consult Note (Addendum)
[facility] Face-to-Face Psychiatry Consult  ? ?Reason for Consult:  paranoia ?Referring Physician:  EDP ?Patient Identification: Anne Hendrix ?MRN:  [id number] ?Principal Diagnosis: Bipolar affective disorder, manic, moderate ([place]) ?Diagnosis:  Principal Problem: ?  Bipolar affective disorder, manic, moderate ([place]) ? ? ?Total Time spent with patient: 45 minutes ? ?Subjective:   ?Anne Hendrix is a 41 y.o. female patient admitted with paranoia, history of bipolar disorder. ? ?HPI:  41 yo female with mania and paranoia, history of bipolar disorder.  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  Tangential at times during the assessment with hyperfocus on not being able to be alone with her older children "per my treatment plan.  I don't know what's on it and don't want to know."  She may be referring to the safety plan she had when she left [facility] recently.  Client continues to fixate on her vape for Delta 8 and not knowing if it is really from the store, paranoid it is something else.  Reports poor sleep the past few nights, appetite is "fair".  Difficult to obtain information, she did provide permission to contact her family, specifically her boyfriend of many years, Anne Hendrix.  He reports her mood changes starting around Easter and was at [facility] for 3 days, "not good for her to be in the ED."  She is not sleeping and paranoid.  Evidently, she told the police the other day she was having suicidal ideations.  He wants her to get help and agreeable to inpatient hospitalization.  She does note him as her "caregiver."  He provided a list of her medications, including Lisinopril 10 mg daily.  She is being titrated off the Lexapro because this increased her symptoms. ? ?Past Psychiatric History: bipolar d/o ? ?Risk to Self:  none ?Risk to Others:  none ?Prior Inpatient Therapy:  yes ?Prior Outpatient Therapy:  [facility] ? ?Past Medical History:  ?Past  Medical History:  ?Diagnosis Date  ? Herpes   ? Migraine   ?  ?Past Surgical History:  ?Procedure Laterality Date  ? TUBAL LIGATION    ? uterine ablation    ? ?Family History:  ?Family History  ?Problem Relation Age of Onset  ? Breast cancer Paternal Aunt   ? Breast cancer Paternal Grandmother   ? ?Family Psychiatric  History: see above ?Social History:  ?Social History  ? ?Substance and Sexual Activity  ?Alcohol Use Not Currently  ?   ?Social History  ? ?Substance and Sexual Activity  ?Drug Use Yes  ? Types: Marijuana  ?  ?Social History  ? ?Socioeconomic History  ? Marital status: Married  ?  Spouse name: Not on file  ? Number of children: Not on file  ? Years of education: Not on file  ? Highest education level: Not on file  ?Occupational History  ? Not on file  ?Tobacco Use  ? Smoking status: Every Day  ? Smokeless tobacco: Not on file  ?Substance and Sexual Activity  ? Alcohol use: Not Currently  ? Drug use: Yes  ?  Types: Marijuana  ? Sexual activity: Not on file  ?Other Topics Concern  ? Not on file  ?Social History Narrative  ? Not on file  ? ?Social Determinants of Health  ? ?Financial Resource Strain: Not on file  ?Food Insecurity: Not on file  ?Transportation Needs: Not on file  ?Physical Activity: Not on file  ?Stress:  Not on file  ?Social Connections: Not on file  ? ?Additional Social History: ?  ? ?Allergies:  No Known Allergies ? ?Labs:  ?Results for orders placed or performed during the hospital encounter of 09/15/21 (from the past 48 hour(s))  ?Comprehensive metabolic panel     Status: Abnormal  ? Collection Time: 09/15/21  2:36 PM  ?Result Value Ref Range  ? Sodium 138 135 - 145 mmol/L  ? Potassium 3.8 3.5 - 5.1 mmol/L  ? Chloride 106 98 - 111 mmol/L  ? CO2 27 22 - 32 mmol/L  ? Glucose, Bld 111 (H) 70 - 99 mg/dL  ?  Comment: Glucose reference range applies only to samples taken after fasting for at least 8 hours.  ? BUN 17 6 - 20 mg/dL  ? Creatinine, Ser 0.63 0.44 - 1.00 mg/dL  ? Calcium 8.9 8.9 -  10.3 mg/dL  ? Total Protein 6.9 6.5 - 8.1 g/dL  ? Albumin 4.0 3.5 - 5.0 g/dL  ? AST 15 15 - 41 U/L  ? ALT 13 0 - 44 U/L  ? Alkaline Phosphatase 50 38 - 126 U/L  ? Total Bilirubin 0.4 0.3 - 1.2 mg/dL  ? GFR, Estimated >60 >60 mL/min  ?  Comment: (NOTE) ?Calculated using the CKD-EPI Creatinine Equation (2021) ?  ? Anion gap 5 5 - 15  ?  Comment: Performed at [facility], [address], [place], [place] [address]  ?Ethanol     Status: None  ? Collection Time: 09/15/21  2:36 PM  ?Result Value Ref Range  ? Alcohol, Ethyl (B) <10 <10 mg/dL  ?  Comment: (NOTE) ?Lowest detectable limit for serum alcohol is 10 mg/dL. ? ?For medical purposes only. ?Performed at [facility], [place], ?[place] [address] ?  ?Salicylate level     Status: Abnormal  ? Collection Time: 09/15/21  2:36 PM  ?Result Value Ref Range  ? Salicylate Lvl [id number] (L) 7.0 - 30.0 mg/dL  ?  Comment: Performed at [facility], [address], [place], [place] [address]  ?Acetaminophen level     Status: Abnormal  ? Collection Time: 09/15/21  2:36 PM  ?Result Value Ref Range  ? Acetaminophen (Tylenol), Serum <10 (L) 10 - 30 ug/mL  ?  Comment: (NOTE) ?Therapeutic concentrations vary significantly. A range of 10-30 ug/mL  ?may be an effective concentration for many patients. However, some  ?are best treated at concentrations outside of this range. ?Acetaminophen concentrations >150 ug/mL at 4 hours after ingestion  ?and >50 ug/mL at 12 hours after ingestion are often associated with  ?toxic reactions. ? ?Performed at [facility], [place], ?[place] [address] ?  ?cbc     Status: None  ? Collection Time: 09/15/21  2:36 PM  ?Result Value Ref Range  ? WBC 7.3 4.0 - 10.5 K/uL  ? RBC 4.57 3.87 - 5.11 MIL/uL  ? Hemoglobin 14.0 12.0 - 15.0 g/dL  ? HCT 42.6 36.0 - 46.0 %  ? MCV 93.2 80.0 - 100.0 fL  ? MCH 30.6 26.0 - 34.0 pg  ? MCHC 32.9 30.0 - 36.0 g/dL  ? RDW 12.4 11.5 - 15.5 %  ? Platelets 265 150 -  400 K/uL  ? nRBC 0.0 0.0 - 0.2 %  ?  Comment: Performed at [facility], [address], [place], [place] [address]  ?Urine Drug Screen, Qualitative     Status: Abnormal  ? Collection Time: 09/15/21  2:36 PM  ?  Result Value Ref Range  ? Tricyclic, Ur Screen NONE DETECTED NONE DETECTED  ? Amphetamines, Ur Screen NONE DETECTED NONE DETECTED  ? MDMA (Ecstasy)Ur Screen NONE DETECTED NONE DETECTED  ? Cocaine Metabolite,Ur [place] NONE DETECTED NONE DETECTED  ? Opiate, Ur Screen NONE DETECTED NONE DETECTED  ? Phencyclidine (PCP) Ur S NONE DETECTED NONE DETECTED  ? Cannabinoid 50 Ng, Ur [place] POSITIVE (A) NONE DETECTED  ? Barbiturates, Ur Screen NONE DETECTED NONE DETECTED  ? Benzodiazepine, Ur Scrn NONE DETECTED NONE DETECTED  ? Methadone Scn, Ur NONE DETECTED NONE DETECTED  ?  Comment: (NOTE) ?Tricyclics + metabolites, urine    Cutoff 1000 ng/mL ?Amphetamines + metabolites, urine  Cutoff 1000 ng/mL ?MDMA (Ecstasy), urine              Cutoff 500 ng/mL ?Cocaine Metabolite, urine          Cutoff 300 ng/mL ?Opiate + metabolites, urine        Cutoff 300 ng/mL ?Phencyclidine (PCP), urine         Cutoff 25 ng/mL ?Cannabinoid, urine                 Cutoff 50 ng/mL ?Barbiturates + metabolites, urine  Cutoff 200 ng/mL ?Benzodiazepine, urine              Cutoff 200 ng/mL ?Methadone, urine                   Cutoff 300 ng/mL ? ?The urine drug screen provides only a preliminary, unconfirmed ?analytical test result and should not be used for non-medical ?purposes. Clinical consideration and professional judgment should ?be applied to any positive drug screen result due to possible ?interfering substances. A more specific alternate chemical method ?must be used in order to obtain a confirmed analytical result. ?Gas chromatography / mass spectrometry (GC/MS) is the preferred ?confirm atory method. ?Performed at [facility], [place], ?[place] [address] ?  ? ? ?No current facility-administered medications for  this encounter.  ? ?Current Outpatient Medications  ?Medication Sig Dispense Refill  ? albuterol (PROVENTIL HFA;VENTOLIN HFA) 108 (90 Base) MCG/ACT inhaler Inhale 2 puffs into the lungs every 6 (six) hou
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
## 2021-09-18 ENCOUNTER — Other Ambulatory Visit: Payer: Self-pay

## 2021-09-18 ENCOUNTER — Emergency Department
Admission: EM | Admit: 2021-09-18 | Discharge: 2021-09-19 | Disposition: A | Discharge status: Other Acute Inpatient Hospital | Payer: 59 | Attending: Emergency Medicine | Admitting: Emergency Medicine

## 2021-09-18 DIAGNOSIS — F3112 Bipolar disorder, current episode manic without psychotic features, moderate: Secondary | ICD-10-CM | POA: Diagnosis present

## 2021-09-18 DIAGNOSIS — Z046 Encounter for general psychiatric examination, requested by authority: Secondary | ICD-10-CM | POA: Insufficient documentation

## 2021-09-18 DIAGNOSIS — F3130 Bipolar disorder, current episode depressed, mild or moderate severity, unspecified: Secondary | ICD-10-CM | POA: Insufficient documentation

## 2021-09-18 DIAGNOSIS — Z20822 Contact with and (suspected) exposure to covid-19: Secondary | ICD-10-CM | POA: Insufficient documentation

## 2021-09-18 DIAGNOSIS — F22 Delusional disorders: Secondary | ICD-10-CM | POA: Insufficient documentation

## 2021-09-18 DIAGNOSIS — F23 Brief psychotic disorder: Secondary | ICD-10-CM | POA: Insufficient documentation

## 2021-09-18 LAB — CBC
HCT: 45.4 % (ref 36.0–46.0)
Hemoglobin: 14.8 g/dL (ref 12.0–15.0)
MCH: 30.4 pg (ref 26.0–34.0)
MCHC: 32.6 g/dL (ref 30.0–36.0)
MCV: 93.2 fL (ref 80.0–100.0)
Platelets: 281 10*3/uL (ref 150–400)
RBC: 4.87 MIL/uL (ref 3.87–5.11)
RDW: 12.5 % (ref 11.5–15.5)
WBC: 9.4 10*3/uL (ref 4.0–10.5)
nRBC: 0 % (ref 0.0–0.2)

## 2021-09-18 LAB — URINE DRUG SCREEN, QUALITATIVE (ARMC ONLY)
Amphetamines, Ur Screen: NOT DETECTED
Barbiturates, Ur Screen: NOT DETECTED
Benzodiazepine, Ur Scrn: NOT DETECTED
Cannabinoid 50 Ng, Ur ~~LOC~~: POSITIVE — AB
Cocaine Metabolite,Ur ~~LOC~~: NOT DETECTED
MDMA (Ecstasy)Ur Screen: NOT DETECTED
Methadone Scn, Ur: NOT DETECTED
Opiate, Ur Screen: NOT DETECTED
Phencyclidine (PCP) Ur S: NOT DETECTED
Tricyclic, Ur Screen: POSITIVE — AB

## 2021-09-18 LAB — COMPREHENSIVE METABOLIC PANEL
ALT: 14 U/L (ref 0–44)
AST: 15 U/L (ref 15–41)
Albumin: 4 g/dL (ref 3.5–5.0)
Alkaline Phosphatase: 48 U/L (ref 38–126)
Anion gap: 9 (ref 5–15)
BUN: 11 mg/dL (ref 6–20)
CO2: 26 mmol/L (ref 22–32)
Calcium: 9.1 mg/dL (ref 8.9–10.3)
Chloride: 102 mmol/L (ref 98–111)
Creatinine, Ser: 0.61 mg/dL (ref 0.44–1.00)
GFR, Estimated: >60 mL/min (ref >60)
Glucose, Bld: 88 mg/dL (ref 70–99)
Potassium: 3.8 mmol/L (ref 3.5–5.1)
Sodium: 137 mmol/L (ref 135–145)
Total Bilirubin: 0.3 mg/dL (ref 0.3–1.2)
Total Protein: 7.1 g/dL (ref 6.5–8.1)

## 2021-09-18 LAB — SALICYLATE LEVEL: Salicylate Lvl: <7 mg/dL — ABNORMAL LOW (ref 7.0–30.0)

## 2021-09-18 LAB — ETHANOL: Alcohol, Ethyl (B): <10 mg/dL (ref <10)

## 2021-09-18 LAB — RESP PANEL BY RT-PCR (FLU A&B, COVID) ARPGX2
Influenza A by PCR: NEGATIVE
Influenza B by PCR: NEGATIVE
SARS Coronavirus 2 by RT PCR: NEGATIVE

## 2021-09-18 LAB — ACETAMINOPHEN LEVEL: Acetaminophen (Tylenol), Serum: <10 ug/mL — ABNORMAL LOW (ref 10–30)

## 2021-09-18 LAB — PREGNANCY, URINE: Preg Test, Ur: NEGATIVE

## 2021-09-18 MED ORDER — OLANZAPINE 2.5 MG PO TABS
2.5000 mg | ORAL_TABLET | Freq: Every morning | ORAL | Status: DC
Start: 2021-09-19 — End: 2021-09-19

## 2021-09-18 MED ORDER — ESCITALOPRAM OXALATE 10 MG PO TABS: 20.0000 mg | ORAL_TABLET | Freq: Every day | ORAL | Status: DC

## 2021-09-18 MED ORDER — LISINOPRIL 5 MG PO TABS
10.0000 mg | ORAL_TABLET | Freq: Every day | ORAL | Status: DC
Start: 2021-09-19 — End: 2021-09-19

## 2021-09-18 MED ORDER — LISINOPRIL 10 MG PO TABS: 10.0000 mg | ORAL_TABLET | Freq: Every day | ORAL | Status: DC

## 2021-09-18 MED ORDER — HYDROXYZINE HCL 25 MG PO TABS: 25.0000 mg | ORAL_TABLET | Freq: Three times a day (TID) | ORAL | Status: DC | PRN

## 2021-09-18 MED ORDER — TRAZODONE HCL 100 MG PO TABS
50.0000 mg | ORAL_TABLET | Freq: Every day | ORAL | Status: DC
  Administered 2021-09-18: 50 mg via ORAL
  Filled 2021-09-18: qty 1

## 2021-09-18 MED ORDER — MELATONIN 5 MG PO TABS
10.0000 mg | ORAL_TABLET | Freq: Every day | ORAL | Status: DC
Start: 2021-09-18 — End: 2021-09-19
  Administered 2021-09-18: 10 mg via ORAL
  Filled 2021-09-18: qty 2

## 2021-09-18 MED ORDER — OLANZAPINE 5 MG PO TBDP
10.0000 mg | ORAL_TABLET | Freq: Once | ORAL | Status: AC
  Administered 2021-09-18: 10 mg via ORAL
  Filled 2021-09-18: qty 2

## 2021-09-18 MED ORDER — OLANZAPINE 10 MG PO TABS
10.0000 mg | ORAL_TABLET | Freq: Every day | ORAL | Status: DC
Start: 2021-09-18 — End: 2021-09-19

## 2021-09-18 NOTE — ED Notes (Signed)
Pt has long conversation with this RN about the past couple of months of her life. 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 She has been dx with bipolar about 2 weeks ago and started on medications. States that her daughter and her husband have been living with her nonstop since then and won't let her be alone. She states that she told them she was going to "hurt them" because they wouldn't let her be alone. Even made a plan for her mother to come spend the night so they could go away for a night but states they wouldn't agree to it. When she threatened them, they sent her to [facility] who IVC'd her and sent her here. Pt anxious and tearful, states that she's never been on meds before and getting used to the new meds has been hard. States that she is willing to do whatever it takes to go back home and that she does not want to hurt herself or anyone else. States that she sometimes hears "crackles" but no voices or visual hallucinations.  ?

## 2021-09-18 NOTE — ED Notes (Signed)
Report called to [facility], Security in route for transport ?

## 2021-09-18 NOTE — ED Notes (Signed)
Pt requests night meds due tobeing here at this time. Order placed by Dr. Katrinka Blazing ?

## 2021-09-18 NOTE — ED Triage Notes (Signed)
Pt via BPD from [facility]. Pt currently under IVC. Per IVC paperwork, pt barricaded herself in bedroom and had rapid speech tangential speaking. Pt is also paranoid. ? ?Pt is A&Ox4 and NAD.  ? ?Denies SI/HI. Denies AVH.  ?

## 2021-09-18 NOTE — ED Notes (Signed)
Report received from [place], Conservation officer, nature. Patient alert and oriented, warm and dry, and in no acute distress. Patient denies SI, HI, AVH and pain. Patient made aware of Q15 minute rounds and Engineer, drilling presence for their safety. Patient instructed to come to this nurse with needs or concerns.  ?

## 2021-09-18 NOTE — BH Assessment (Addendum)
Patient is to be admitted to [facility] by Psychiatric Nurse Practitioner Gillermo Murdoch.  ?Attending Physician will be Dr.  Toni Amend .   ?Patient has been assigned to room 303, by [facility] Charge Nurse Marylu Lund.   ?Intake Paper Work has been signed and placed on patient chart.  ?ER staff is aware of the admission: ?Bonita Quin, ER Secretary   ?Dr. Sidney Ace, ER MD  ?Lacinda Axon, Patient's Nurse  ?Rosey Bath, Patient Access.  ? ?Pt pending admissions orders can transport anytime orders have been completed.  ?

## 2021-09-18 NOTE — ED Notes (Signed)
Pt given meal tray and a cup of sprite at this time.  ?
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
## 2021-09-18 NOTE — ED Notes (Signed)
Pt. Transferred to BHU from ED to room 5 after screening for contraband. Report to include Situation, Background, Assessment and Recommendations from Chris RN. Pt. Oriented to unit including Q15 minute rounds as well as the security cameras for their protection. Patient is alert and oriented, warm and dry in no acute distress. Patient denies SI, HI, and AVH. Pt. Encouraged to let me know if needs arise.  

## 2021-09-18 NOTE — ED Provider Notes (Signed)
? ?[facility] ?Provider Note ? ? ? Event Date/Time  ? First MD Initiated Contact with Patient 09/18/21 1743   ?  (approximate) ? ? ?History  ? ?Psychiatric Evaluation ? ? ?HPI ? ?Anne Hendrix is a 41 y.o. female who presents to the ED for evaluation of Psychiatric Evaluation ?  ?I reviewed [facility] psych consult note from 4/12.  Seen for acute mania and psychoses.  Does not meet IVC criteria then. ? ?She presents to the ED today under IVC from [facility] for evaluation of paranoia.  I review IVC paperwork. ? ? ?Physical Exam  ? ?Triage Vital Signs: ?ED Triage Vitals  ?Enc Vitals Group  ?   BP 09/18/21 1723 116/85  ?   Pulse Rate 09/18/21 1723 79  ?   Resp 09/18/21 1723 18  ?   Temp 09/18/21 1723 98.2 ?F (36.8 ?C)  ?   Temp Source 09/18/21 1723 Oral  ?   SpO2 09/18/21 1723 99 %  ?   Weight 09/18/21 1722 120 lb (54.4 kg)  ?   Height 09/18/21 1722 5\' 5"  (1.651 m)  ?   Head Circumference --   ?   Peak Flow --   ?   Pain Score 09/18/21 1722 0  ?   Pain Loc --   ?   Pain Edu? --   ?   Excl. in GC? --   ? ? ?Most recent vital signs: ?Vitals:  ? 09/18/21 1723  ?BP: 116/85  ?Pulse: 79  ?Resp: 18  ?Temp: 98.2 ?F (36.8 ?C)  ?SpO2: 99%  ? ? ?General: Awake, no distress.  Sitting up in bed and eating food.  Pressured speech.  Linear thoughts. ?CV:  Good peripheral perfusion.  ?Resp:  Normal effort.  ?Abd:  No distention.  ?MSK:  No deformity noted.  ?Neuro:  No focal deficits appreciated. ?Other:   ? ? ?ED Results / Procedures / Treatments  ? ?Labs ?(all labs ordered are listed, but only abnormal results are displayed) ?Labs Reviewed  ?SALICYLATE LEVEL - Abnormal; Notable for the following components:  ?    Result Value  ? Salicylate Lvl <7.0 (*)   ? All other components within normal limits  ?ACETAMINOPHEN LEVEL - Abnormal; Notable for the following components:  ? Acetaminophen (Tylenol), Serum <10 (*)   ? All other components within normal limits  ?URINE DRUG SCREEN, QUALITATIVE ([facility] ONLY) - Abnormal; Notable  for the following components:  ? Tricyclic, Ur Screen POSITIVE (*)   ? Cannabinoid 50 Ng, Ur Ina POSITIVE (*)   ? All other components within normal limits  ?RESP PANEL BY RT-PCR (FLU A&B, COVID) ARPGX2  ?COMPREHENSIVE METABOLIC PANEL  ?ETHANOL  ?CBC  ?PREGNANCY, URINE  ?POC URINE PREG, ED  ? ? ?EKG ? ? ?RADIOLOGY ? ? ?Official radiology report(s): ?No results found. ? ?PROCEDURES and INTERVENTIONS: ? ?Procedures ? ?Medications  ?OLANZapine zydis (ZYPREXA) disintegrating tablet 10 mg (10 mg Oral Given 09/18/21 1845)  ? ? ? ?IMPRESSION / MDM / ASSESSMENT AND PLAN / ED COURSE  ?I reviewed the triage vital signs and the nursing notes. ? ?41 year old female with history of multiple psychiatric conditions presents to the ED under IVC for evaluation of paranoia at home.  No evidence of medical pathology to preclude psychiatric evaluation and disposition.  Reassuring vital signs and blood work.  Normal CBC and metabolic panel.  No evidence of toxidromes or coingestions.  Provided a sublingual Zyprexa at her request.  We will consult with  psychiatry for evaluation. ? ?Clinical Course as of 09/18/21 2008  ?Wed Sep 18, 2021  ?2008 The patient has been placed in psychiatric observation due to the need to provide a safe environment for the patient while obtaining psychiatric consultation and evaluation, as well as ongoing medical and medication management to treat the patient's condition.  The patient has been placed under full IVC at this time. ? ? [DS]  ?  ?Clinical Course User Index ?[DS] Delton Prairie, MD  ? ? ? ?FINAL CLINICAL IMPRESSION(S) / ED DIAGNOSES  ? ?Final diagnoses:  ?Acute psychosis (HCC)  ?Paranoia (HCC)  ? ? ? ?Rx / DC Orders  ? ?ED Discharge Orders   ? ? None  ? ?  ? ? ? ?Note:  This document was prepared using Dragon voice recognition software and may include unintentional dictation errors. ?  ?Delton Prairie, MD ?09/18/21 2009 ? ?

## 2021-09-18 NOTE — ED Notes (Signed)
Pt given snack and drink 

## 2021-09-18 NOTE — ED Notes (Signed)
Belongings include (bag 1/1):   ? ?1 pink shirt  ?1 pink pants  ?1 pair black shoes  ?1 pair of multi-colored socks ?1 gray bra  ?1 pair of pink underwear  ? ? ?

## 2021-09-19 ENCOUNTER — Other Ambulatory Visit: Payer: Self-pay

## 2021-09-19 ENCOUNTER — Inpatient Hospital Stay
Admission: AD | Admit: 2021-09-19 | Discharge: 2021-09-24 | DRG: 885 | Disposition: A | Discharge status: Home / Self Care | Payer: 59 | Source: Intra-hospital | Attending: Psychiatry | Admitting: Psychiatry

## 2021-09-19 DIAGNOSIS — F17200 Nicotine dependence, unspecified, uncomplicated: Secondary | ICD-10-CM | POA: Diagnosis present

## 2021-09-19 DIAGNOSIS — F22 Delusional disorders: Secondary | ICD-10-CM | POA: Diagnosis present

## 2021-09-19 DIAGNOSIS — Z79899 Other long term (current) drug therapy: Secondary | ICD-10-CM

## 2021-09-19 DIAGNOSIS — F319 Bipolar disorder, unspecified: Secondary | ICD-10-CM | POA: Insufficient documentation

## 2021-09-19 DIAGNOSIS — Z20822 Contact with and (suspected) exposure to covid-19: Secondary | ICD-10-CM | POA: Diagnosis present

## 2021-09-19 DIAGNOSIS — G4700 Insomnia, unspecified: Secondary | ICD-10-CM | POA: Diagnosis present

## 2021-09-19 DIAGNOSIS — I10 Essential (primary) hypertension: Secondary | ICD-10-CM | POA: Diagnosis present

## 2021-09-19 DIAGNOSIS — F3112 Bipolar disorder, current episode manic without psychotic features, moderate: Secondary | ICD-10-CM | POA: Diagnosis present

## 2021-09-19 DIAGNOSIS — F1210 Cannabis abuse, uncomplicated: Secondary | ICD-10-CM | POA: Diagnosis present

## 2021-09-19 MED ORDER — ACETAMINOPHEN 325 MG PO TABS
650.0000 mg | ORAL_TABLET | Freq: Four times a day (QID) | ORAL | Status: DC | PRN
Start: 2021-09-19 — End: 2021-09-24

## 2021-09-19 MED ORDER — HYDROXYZINE HCL 25 MG PO TABS
25.0000 mg | ORAL_TABLET | Freq: Three times a day (TID) | ORAL | Status: DC | PRN
Start: 2021-09-19 — End: 2021-09-20
  Administered 2021-09-19 – 2021-09-20 (×2): 25 mg via ORAL
  Filled 2021-09-19 (×5): qty 1

## 2021-09-19 MED ORDER — LITHIUM CARBONATE ER 300 MG PO TBCR
300.0000 mg | EXTENDED_RELEASE_TABLET | Freq: Two times a day (BID) | ORAL | Status: DC
  Administered 2021-09-19 – 2021-09-20 (×3): 300 mg via ORAL
  Filled 2021-09-19 (×4): qty 1

## 2021-09-19 MED ORDER — OLANZAPINE 5 MG PO TABS
2.5000 mg | ORAL_TABLET | Freq: Every morning | ORAL | Status: DC
  Administered 2021-09-19: 2.5 mg via ORAL
  Filled 2021-09-19: qty 1

## 2021-09-19 MED ORDER — ALUM & MAG HYDROXIDE-SIMETH 200-200-20 MG/5ML PO SUSP
30.0000 mL | ORAL | Status: DC | PRN
Start: 2021-09-19 — End: 2021-09-24

## 2021-09-19 MED ORDER — LISINOPRIL 5 MG PO TABS
10.0000 mg | ORAL_TABLET | Freq: Every day | ORAL | Status: DC
  Administered 2021-09-19 – 2021-09-23 (×4): 10 mg via ORAL
  Filled 2021-09-19 (×5): qty 2

## 2021-09-19 MED ORDER — OLANZAPINE 10 MG PO TABS: 10.0000 mg | ORAL_TABLET | Freq: Every day | ORAL | Status: DC

## 2021-09-19 MED ORDER — MAGNESIUM HYDROXIDE 400 MG/5ML PO SUSP
30.0000 mL | Freq: Every day | ORAL | Status: DC | PRN
Start: 2021-09-19 — End: 2021-09-24

## 2021-09-19 MED ORDER — DIPHENHYDRAMINE HCL 25 MG PO CAPS
50.0000 mg | ORAL_CAPSULE | Freq: Every evening | ORAL | Status: DC | PRN
  Administered 2021-09-20 – 2021-09-23 (×4): 50 mg via ORAL
  Filled 2021-09-19 (×5): qty 2

## 2021-09-19 MED ORDER — OLANZAPINE 10 MG PO TBDP
10.0000 mg | ORAL_TABLET | Freq: Once | ORAL | Status: AC
  Administered 2021-09-19: 10 mg via ORAL
  Filled 2021-09-19: qty 1

## 2021-09-19 MED ORDER — ESCITALOPRAM OXALATE 10 MG PO TABS
20.0000 mg | ORAL_TABLET | Freq: Every day | ORAL | Status: DC
  Administered 2021-09-19: 20 mg via ORAL
  Filled 2021-09-19: qty 2

## 2021-09-19 MED ORDER — TRAZODONE HCL 50 MG PO TABS: 50.0000 mg | ORAL_TABLET | Freq: Every day | ORAL | Status: DC

## 2021-09-19 NOTE — Progress Notes (Signed)
Recreation Therapy Notes ? ?Date: 09/19/2021 ? ?Time: 10:00 AM  ? ?Location: Courtyard   ? ?Behavioral response: Appropriate ? ?Intervention Topic: Wellness     ? ?Discussion/Intervention:  ?Group content today was focused on Wellness. The group defined wellness and some positive ways they make decisions for themselves. Individuals expressed reasons why they neglected any wellness in the past. Patients described ways to improve wellness skills in the future. The group explained what could happen if they did not do any wellness at all. Participants express how bad choices has affected them and others around them. Individual explained the importance of wellness. The group participated in the intervention ?Testing my Wellness? where they had a chance to identify some of their weaknesses and strengths in wellness.  ?Clinical Observations/Feedback: ?Patient came to group walked around for five minutes and left group but did not return.  ?Bryant Saye LRT/CTRS  ? ? ? ? ? ? ? ?Anne Hendrix ?09/19/2021 11:46 AM ?
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
## 2021-09-19 NOTE — Plan of Care (Signed)
  Problem: Education: Goal: Knowledge of [facility] Education information/materials will improve Outcome: Progressing Goal: Emotional status will improve Outcome: Progressing Goal: Mental status will improve Outcome: Progressing Goal: Verbalization of understanding the information provided will improve Outcome: Progressing   Problem: Activity: Goal: Interest or engagement in activities will improve Outcome: Progressing Goal: Sleeping patterns will improve Outcome: Progressing   Problem: Coping: Goal: Ability to verbalize frustrations and anger appropriately will improve Outcome: Progressing Goal: Ability to demonstrate self-control will improve Outcome: Progressing   Problem: Health Behavior/Discharge Planning: Goal: Identification of resources available to assist in meeting health care needs will improve Outcome: Progressing Goal: Compliance with treatment plan for underlying cause of condition will improve Outcome: Progressing   Problem: Physical Regulation: Goal: Ability to maintain clinical measurements within normal limits will improve Outcome: Progressing   Problem: Safety: Goal: Periods of time without injury will increase Outcome: Progressing   Problem: Activity: Goal: Will verbalize the importance of balancing activity with adequate rest periods Outcome: Progressing   Problem: Education: Goal: Will be free of psychotic symptoms Outcome: Progressing Goal: Knowledge of the prescribed therapeutic regimen will improve Outcome: Progressing   Problem: Coping: Goal: Coping ability will improve Outcome: Progressing Goal: Will verbalize feelings Outcome: Progressing   Problem: Health Behavior/Discharge Planning: Goal: Compliance with prescribed medication regimen will improve Outcome: Progressing   Problem: Nutritional: Goal: Ability to achieve adequate nutritional intake will improve Outcome: Progressing   Problem: Role Relationship: Goal:  Ability to communicate needs accurately will improve Outcome: Progressing Goal: Ability to interact with others will improve Outcome: Progressing   Problem: Safety: Goal: Ability to redirect hostility and anger into socially appropriate behaviors will improve Outcome: Progressing Goal: Ability to remain free from injury will improve Outcome: Progressing   Problem: Self-Care: Goal: Ability to participate in self-care as condition permits will improve Outcome: Progressing   Problem: Self-Concept: Goal: Will verbalize positive feelings about self Outcome: Progressing   

## 2021-09-19 NOTE — Plan of Care (Signed)
?  Problem: Education: ?Goal: Knowledge of [facility] Education information/materials will improve ?Outcome: Progressing ?Goal: Verbalization of understanding the information provided will improve ?Outcome: Progressing ?  ?Problem: Health Behavior/Discharge Planning: ?Goal: Compliance with treatment plan for underlying cause of condition will improve ?Outcome: Progressing ?  ?Problem: Safety: ?Goal: Periods of time without injury will increase ?Outcome: Progressing ?  ?Problem: Education: ?Goal: Knowledge of the prescribed therapeutic regimen will improve ?Outcome: Progressing ?  ?Problem: Health Behavior/Discharge Planning: ?Goal: Compliance with prescribed medication regimen will improve ?Outcome: Progressing ?  ?Problem: Role Relationship: ?Goal: Ability to communicate needs accurately will improve ?Outcome: Progressing ?  ?Problem: Safety: ?Goal: Ability to remain free from injury will improve ?Outcome: Progressing ?  ?

## 2021-09-19 NOTE — [facility] Suicide Risk Assessment (Signed)
[facility] INPATIENT:  Family/Significant Other Suicide Prevention Education ? ?Suicide Prevention Education:  ?Patient Refusal for Family/Significant Other Suicide Prevention Education: The patient Anne Hendrix has refused to provide written consent for family/significant other to be provided Family/Significant Other Suicide Prevention Education during admission and/or prior to discharge.  Physician notified. ? ?SPE completed with pt, as pt refused to consent to family contact. SPI pamphlet provided to pt and pt was encouraged to share information with support network, ask questions, and talk about any concerns relating to SPE. Pt denies access to guns/firearms and verbalized understanding of information provided. Mobile Crisis information also provided to pt.  ? ?Harden Mo ?09/19/2021, 3:23 PM ?

## 2021-09-19 NOTE — H&P (Signed)
Psychiatric Admission Assessment Adult ? ?Patient Identification: Anne Hendrix ?MRN:  [id number] ?Date of Evaluation:  09/19/2021 ?Chief Complaint:  Bipolar 1 disorder (HCC) [F31.9] ?Principal Diagnosis: Bipolar affective disorder, manic, moderate (HCC) ?Diagnosis:  Principal Problem: ?  Bipolar affective disorder, manic, moderate (HCC) ?Active Problems: ?  Bipolar 1 disorder (HCC) ?  Cannabis abuse ? ?History of Present Illness: A 41 year old woman brought to the hospital under involuntary commitment initiated at our [facility].  Patient was taken there by law enforcement after what sounds like an episode of aggression towards her daughter at home.  In interview today the patient is slightly agitated hyperverbal tangential some vague paranoia although no definite psychotic symptoms.  She denies current suicidal ideation.  Admits that she got angry enough to make threatening statements to her daughter but denies that she actually did anything physically aggressive.  Patient is not sleeping well.  Mood continues to be labile thoughts disorganized.  She had a hospitalization in [place] very briefly at the beginning of this month where she was diagnosed with bipolar disorder.  She says she has continued to be compliant with the medications they prescribed which were Lexapro and Zyprexa.  Admits that she continues to use "delta 10" vapor products although she says she has used it very little since she was in the hospital.  Denies other drug use or alcohol use. ?Associated Signs/Symptoms: ?Depression Symptoms:  insomnia, ?difficulty concentrating, ?suicidal thoughts without plan, ?Duration of Depression Symptoms: Greater than two weeks ? ?(Hypo) Manic Symptoms:  Flight of Ideas, ?Impulsivity, ?Irritable Mood, ?Labiality of Mood, ?Anxiety Symptoms:  Excessive Worry, ?Psychotic Symptoms:  Paranoia, ?PTSD Symptoms: ?Negative ?Total Time spent with patient: 1 hour ? ?Past Psychiatric History: Past history of depression that had  been treated with serotonin reuptake inhibitors in the past.  Past treatment with S-Citalopram and buspirone.  Other than the stay in [place] in April 2023 no previous psychiatric hospitalizations.  Denies any history of suicide attempts or violence.  Patient admits that she used to have a problem with alcohol use but says that on her own she has cut it down and now drinks almost none at all.  Says she has not had any alcohol since last November. ? ?Is the patient at risk to self? Yes.    ?Has the patient been a risk to self in the past 6 months? Yes.    ?Has the patient been a risk to self within the distant past? No.  ?Is the patient a risk to others? Yes.    ?Has the patient been a risk to others in the past 6 months? Yes.    ?Has the patient been a risk to others within the distant past? No.  ? ?Prior Inpatient Therapy:   ?Prior Outpatient Therapy:   ? ?Alcohol Screening: 1. How often do you have a drink containing alcohol?: Never ?2. How many drinks containing alcohol do you have on a typical day when you are drinking?: 1 or 2 ?3. How often do you have six or more drinks on one occasion?: Never ?AUDIT-C Score: 0 ?4. How often during the last year have you found that you were not able to stop drinking once you had started?: Never ?5. How often during the last year have you failed to do what was normally expected from you because of drinking?: Never ?6. How often during the last year have you needed a first drink in the morning to get yourself going after a heavy drinking session?: Never ?  7. How often during the last year have you had a feeling of guilt of remorse after drinking?: Never ?8. How often during the last year have you been unable to remember what happened the night before because you had been drinking?: Never ?9. Have you or someone else been injured as a result of your drinking?: No ?10. Has a relative or friend or a doctor or another health worker been concerned about your drinking or suggested  you cut down?: No ?Alcohol Use Disorder Identification Test Final Score (AUDIT): 0 ?Substance Abuse History in the last 12 months:  Yes.   ?Consequences of Substance Abuse: ?It sounds like in the past alcohol abuse may have been its own problem but most recently her continued use of cannabis they being products has probably contributed to ongoing symptoms ?Previous Psychotropic Medications: Yes  ?Psychological Evaluations: Yes  ?Past Medical History:  ?Past Medical History:  ?Diagnosis Date  ? Herpes   ? Migraine   ?  ?Past Surgical History:  ?Procedure Laterality Date  ? TUBAL LIGATION    ? uterine ablation    ? ?Family History:  ?Family History  ?Problem Relation Age of Onset  ? Breast cancer Paternal Aunt   ? Breast cancer Paternal Grandmother   ? ?Family Psychiatric  History: Patient says she had a great aunt who had a chronic mental illness.  Does not report any other known biological relatives ?Tobacco Screening:   ?Social History:  ?Social History  ? ?Substance and Sexual Activity  ?Alcohol Use Not Currently  ?   ?Social History  ? ?Substance and Sexual Activity  ?Drug Use Yes  ? Types: Marijuana  ?  ?Additional Social History: ?  ?   ?  ?  ?  ?  ?  ?  ?  ?  ?  ?  ? ?Allergies:  No Known Allergies ?Lab Results:  ?Results for orders placed or performed during the hospital encounter of 09/18/21 (from the past 48 hour(s))  ?Comprehensive metabolic panel     Status: None  ? Collection Time: 09/18/21  5:25 PM  ?Result Value Ref Range  ? Sodium 137 135 - 145 mmol/L  ? Potassium 3.8 3.5 - 5.1 mmol/L  ? Chloride 102 98 - 111 mmol/L  ? CO2 26 22 - 32 mmol/L  ? Glucose, Bld 88 70 - 99 mg/dL  ?  Comment: Glucose reference range applies only to samples taken after fasting for at least 8 hours.  ? BUN 11 6 - 20 mg/dL  ? Creatinine, Ser 0.61 0.44 - 1.00 mg/dL  ? Calcium 9.1 8.9 - 10.3 mg/dL  ? Total Protein 7.1 6.5 - 8.1 g/dL  ? Albumin 4.0 3.5 - 5.0 g/dL  ? AST 15 15 - 41 U/L  ? ALT 14 0 - 44 U/L  ? Alkaline Phosphatase  48 38 - 126 U/L  ? Total Bilirubin 0.3 0.3 - 1.2 mg/dL  ? GFR, Estimated >60 >60 mL/min  ?  Comment: (NOTE) ?Calculated using the CKD-EPI Creatinine Equation (2021) ?  ? Anion gap 9 5 - 15  ?  Comment: Performed at [facility], [address] [place], [place] [address]  ?Ethanol     Status: None  ? Collection Time: 09/18/21  5:25 PM  ?Result Value Ref Range  ? Alcohol, Ethyl (B) <10 <10 mg/dL  ?  Comment: (NOTE) ?Lowest detectable limit for serum alcohol is 10 mg/dL. ? ?For medical purposes only. ?Performed at [facility], [address] [place], ?[place] [id number] ?  ?Salicylate level     Status: Abnormal  ? Collection Time: 09/18/21  5:25 PM  ?Result Value Ref Range  ? Salicylate Lvl <7.0 (L) 7.0 - 30.0 mg/dL  ?  Comment: Performed at [facility], [address] [place], [place] [address]  ?Acetaminophen level     Status: Abnormal  ? Collection Time: 09/18/21  5:25 PM  ?Result Value Ref Range  ? Acetaminophen (Tylenol), Serum <10 (L) 10 - 30 ug/mL  ?  Comment: (NOTE) ?Therapeutic concentrations vary significantly. A range of 10-30 ug/mL  ?may be an effective concentration for many patients. However, some  ?are best treated at concentrations outside of this range. ?Acetaminophen concentrations >150 ug/mL at 4 hours after ingestion  ?and >50 ug/mL at 12 hours after ingestion are often associated with  ?toxic reactions. ? ?Performed at [facility], [address] [place], ?[place] [address] ?  ?cbc     Status: None  ? Collection Time: 09/18/21  5:25 PM  ?Result Value Ref Range  ? WBC 9.4 4.0 - 10.5 K/uL  ? RBC 4.87 3.87 - 5.11 MIL/uL  ? Hemoglobin 14.8 12.0 - 15.0 g/dL  ? HCT 45.4 36.0 - 46.0 %  ? MCV 93.2 80.0 - 100.0 fL  ? MCH 30.4 26.0 - 34.0 pg  ? MCHC 32.6 30.0 - 36.0 g/dL  ? RDW 12.5 11.5 - 15.5 %  ? Platelets 281 150 - 400 K/uL  ? nRBC 0.0 0.0 - 0.2 %  ?  Comment: Performed at [facility], [address] [place], [place] [address]  ?Urine Drug Screen,  Qualitative     Status: Abnormal  ? Collection Time: 09/18/21  5:25 PM  ?Result Value Ref Range  ? Tricyclic, Ur Screen POSITIVE (A) NONE DETECTED  ? Amphetamines, Ur Screen NONE DETECTED NONE DETECTED

## 2021-09-19 NOTE — Progress Notes (Signed)
Pt visible on unit she interacts staff, but minimal with peers. She denies SI/HI and AVH. Pt's goal is to get bette, so she can  to be discharged in time for her son 's 18th birthday and her anniversary with boyfriend. Pt was observed on the phone irritated while talking to someone and getting a little loud. Pt did her ADL's and has been mostly in her room. ?
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
## 2021-09-19 NOTE — Progress Notes (Signed)
D: Patient alert and oriented. Patient denies pain. Patient rates anxiety and depression 4/10 at time of assessment. Patient denies SI/HI/AVH. ?Patient requested PRN anxiety medication for anxiety rated 10/10. Patient states interacting during lunch and group caused anxiety to increase.  ? ?A: Scheduled medications administered to patient, per MD orders.  Support and encouragement provided to patient.  ?Q15 minute safety checks maintained.  ? ?R: Patient compliant with medication administration and treatment plan. No adverse drug reactions noted. Patient remains safe on the unit at this time.  ?

## 2021-09-19 NOTE — ED Notes (Signed)
Patient is calm and cooperative. She is transferred to [facility] via NT and officer. Patient belongings and IVC paper sent with the patient. Report given to [facility]. No issues. ?

## 2021-09-19 NOTE — Tx Team (Signed)
Initial Treatment Plan ?09/19/2021 ?3:39 AM ?Dione Housekeeper ?[id number]:[id number] ? ? ? ?PATIENT STRESSORS: ?Marital or family conflict   ?Substance abuse   ? ? ?PATIENT STRENGTHS: ?Average or above average intelligence  ?Capable of independent living  ?Communication skills  ?General fund of knowledge  ?Motivation for treatment/growth  ? ? ?PATIENT IDENTIFIED PROBLEMS: ?Bipolar Disorder  ?Family stress  ?Substance use disorder  ?  ?  ?  ?  ?  ?  ?  ? ?DISCHARGE CRITERIA:  ?Improved stabilization in mood, thinking, and/or behavior ? ?PRELIMINARY DISCHARGE PLAN: ?Outpatient therapy ? ?PATIENT/FAMILY INVOLVEMENT: ?This treatment plan has been presented to and reviewed with the patient, Anne Hendrix, and/or family member,  ?.  The patient and family have been given the opportunity to ask questions and make suggestions. ? ?Shelia Media, RN ?09/19/2021, 3:39 AM ?

## 2021-09-19 NOTE — [facility] Suicide Risk Assessment (Signed)
[facility] Admission Suicide Risk Assessment ? ? ?Nursing information obtained from:  Patient ?Demographic factors:  Caucasian ?Current Mental Status:  NA ?Loss Factors:  NA ?Historical Factors:  Family history of suicide ?Risk Reduction Factors:  Sense of responsibility to family, Living with another person, especially a relative ? ?Total Time spent with patient: 1 hour ?Principal Problem: Bipolar affective disorder, manic, moderate ([place]) ?Diagnosis:  Principal Problem: ?  Bipolar affective disorder, manic, moderate ([place]) ?Active Problems: ?  Bipolar 1 disorder ([place]) ?  Cannabis abuse ? ?Subjective Data: Patient seen and chart reviewed.  41 year old woman with recent onset of manic like symptoms brought to the hospital under IVC.  Patient admits that she had had thoughts over the last month occasionally of wishing she was dead but denies ever having had any plan or intention to harm herself.  She says that currently she no longer even has those thoughts about wishing she is dead.  Patient is currently basically cooperating with treatment. ? ?Continued Clinical Symptoms:  ?Alcohol Use Disorder Identification Test Final Score (AUDIT): 0 ?The "Alcohol Use Disorders Identification Test", Guidelines for Use in Primary Care, Second Edition.  World Pharmacologist [facility]). ?Score between 0-7:  no or low risk or alcohol related problems. ?Score between 8-15:  moderate risk of alcohol related problems. ?Score between 16-19:  high risk of alcohol related problems. ?Score 20 or above:  warrants further diagnostic evaluation for alcohol dependence and treatment. ? ? ?CLINICAL FACTORS:  ? Bipolar Disorder:   Mixed State ? ? ?Musculoskeletal: ?Strength & Muscle Tone: within normal limits ?Gait & Station: normal ?Patient leans: N/A ? ?Psychiatric Specialty Exam: ? ?Presentation  ?General Appearance: Appropriate for Environment ? ?Eye Contact:Good ? ?Speech:Pressured ? ?Speech Volume:Increased ? ?Handedness:Right ? ? ?Mood and Affect   ?Mood:No data recorded ?Affect:No data recorded ? ?Thought Process  ?Thought Processes:Linear ? ?Descriptions of Associations:Loose ? ?Orientation:Full (Time, Place and Person) ? ?Thought Content:Logical; Paranoid Ideation; Rumination; Tangential ? ?History of Schizophrenia/Schizoaffective disorder:No ? ?Duration of Psychotic Symptoms:Greater than six months ? ?Hallucinations:Hallucinations: None ? ?Ideas of Reference:None ? ?Suicidal Thoughts:Suicidal Thoughts: No ? ?Homicidal Thoughts:Homicidal Thoughts: No ? ? ?Sensorium  ?Memory:Immediate Good; Recent Good; Remote Good ? ?Judgment:Poor ? ?Insight:Poor ? ? ?Executive Functions  ?Concentration:Fair ? ?Attention Span:Fair ? ?Recall:Fair ? ?[place] ? ?Language:Good ? ? ?Psychomotor Activity  ?Psychomotor Activity:Psychomotor Activity: Normal ? ? ?Assets  ?Assets:Communication Skills; Desire for Improvement; Financial Resources/Insurance; Leisure Time; Resilience; Social Support ? ? ?Sleep  ?Sleep:Sleep: Poor ?Number of Hours of Sleep: 2 ? ? ? ?Physical Exam: ?Physical Exam ?Vitals reviewed.  ?Constitutional:   ?   Appearance: Normal appearance.  ?HENT:  ?   Head: Normocephalic and atraumatic.  ?   Mouth/Throat:  ?   Pharynx: Oropharynx is clear.  ?Eyes:  ?   Pupils: Pupils are equal, round, and reactive to light.  ?Cardiovascular:  ?   Rate and Rhythm: Normal rate and regular rhythm.  ?Pulmonary:  ?   Effort: Pulmonary effort is normal.  ?   Breath sounds: Normal breath sounds.  ?Abdominal:  ?   General: Abdomen is flat.  ?   Palpations: Abdomen is soft.  ?Musculoskeletal:     ?   General: Normal range of motion.  ?Skin: ?   General: Skin is warm and dry.  ?Neurological:  ?   General: No focal deficit present.  ?   Mental Status: She is alert. Mental status is at baseline.  ?Psychiatric:     ?  Attention and Perception: Attention normal.     ?   Mood and Affect: Mood is anxious. Affect is labile.     ?   Speech: Speech is rapid and pressured and  tangential.     ?   Behavior: Behavior is agitated. Behavior is not aggressive.     ?   Thought Content: Thought content is paranoid. Thought content does not include homicidal or suicidal ideation.     ?   Cognition and Memory: Memory is impaired.     ?   Judgment: Judgment is impulsive.  ? ?Review of Systems  ?Constitutional: Negative.   ?HENT: Negative.    ?Eyes: Negative.   ?Respiratory: Negative.    ?Cardiovascular: Negative.   ?Gastrointestinal: Negative.   ?Musculoskeletal: Negative.   ?Skin: Negative.   ?Neurological: Negative.   ?Psychiatric/Behavioral:  Positive for substance abuse. Negative for depression, hallucinations, memory loss and suicidal ideas. The patient is nervous/anxious and has insomnia.   ?Blood pressure 124/81, pulse 88, temperature 98.6 ?F (37 ?C), temperature source Oral, resp. rate 18, height 5\' 5"  (1.651 m), weight 52.2 kg, SpO2 98 %. Body mass index is 19.14 kg/m?. ? ? ?COGNITIVE FEATURES THAT CONTRIBUTE TO RISK:  ?Thought constriction (tunnel vision)   ? ?SUICIDE RISK:  ? Mild:  Suicidal ideation of limited frequency, intensity, duration, and specificity.  There are no identifiable plans, no associated intent, mild dysphoria and related symptoms, good self-control (both objective and subjective assessment), few other risk factors, and identifiable protective factors, including available and accessible social support. ? ?PLAN OF CARE: Continue 15-minute checks.  Review medication and labs.  Initiate medication treatment as appropriate.  Full treatment team assessment.  Engage in individual and group therapy.  Ongoing assessment of dangerousness prior to discharge ? ?I certify that inpatient services furnished can reasonably be expected to improve the patient's condition.  ? ?Alethia Berthold, MD ?09/19/2021, 11:25 AM ? ?
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
## 2021-09-20 DIAGNOSIS — F3112 Bipolar disorder, current episode manic without psychotic features, moderate: Secondary | ICD-10-CM | POA: Diagnosis not present

## 2021-09-20 MED ORDER — LITHIUM CARBONATE ER 450 MG PO TBCR
450.0000 mg | EXTENDED_RELEASE_TABLET | Freq: Two times a day (BID) | ORAL | Status: DC
  Administered 2021-09-20 – 2021-09-24 (×8): 450 mg via ORAL
  Filled 2021-09-20 (×9): qty 1

## 2021-09-20 MED ORDER — DOCUSATE SODIUM 100 MG PO CAPS
100.0000 mg | ORAL_CAPSULE | Freq: Two times a day (BID) | ORAL | Status: DC
  Administered 2021-09-20 – 2021-09-24 (×8): 100 mg via ORAL
  Filled 2021-09-20 (×8): qty 1

## 2021-09-20 MED ORDER — HYDROXYZINE HCL 50 MG PO TABS
50.0000 mg | ORAL_TABLET | Freq: Four times a day (QID) | ORAL | Status: DC | PRN
Start: 2021-09-20 — End: 2021-09-24
  Administered 2021-09-20 – 2021-09-23 (×5): 50 mg via ORAL
  Filled 2021-09-20 (×6): qty 1

## 2021-09-20 MED ORDER — PSYLLIUM 95 % PO PACK
1.0000 | PACK | Freq: Every day | ORAL | Status: DC
  Administered 2021-09-20 – 2021-09-24 (×5): 1 via ORAL
  Filled 2021-09-20 (×5): qty 1

## 2021-09-20 NOTE — Progress Notes (Signed)
[facility] MD Progress Note ? ?09/20/2021 10:47 AM ?Anne Hendrix  ?MRN:  [id number] ?Subjective: Patient seen and chart reviewed.  41 year old woman admitted with symptoms of mania.  Met with treatment team today.  Patient had pressured speech and was showing an inappropriate affect with labile hostile and angry behavior.  Very tangential speech somewhat disorganized.  Nothing frankly bizarre but she did say that she thought that perhaps the fire alarm earlier today was done intentionally just to upset her.  So far cooperative with medicine.  Not aggressive towards anyone else here on the unit.  Insight partial at best.  No new physical complaints but is requesting her chronic constipation medicine ?Principal Problem: Bipolar affective disorder, manic, moderate ([place]) ?Diagnosis: Principal Problem: ?  Bipolar affective disorder, manic, moderate ([place]) ?Active Problems: ?  Bipolar 1 disorder ([place]) ?  Cannabis abuse ? ?Total Time spent with patient: 30 minutes ? ?Past Psychiatric History: Past history of previous depression and anxiety with only recent onset of what sounds like manic symptoms ? ?Past Medical History:  ?Past Medical History:  ?Diagnosis Date  ? Herpes   ? Migraine   ?  ?Past Surgical History:  ?Procedure Laterality Date  ? TUBAL LIGATION    ? uterine ablation    ? ?Family History:  ?Family History  ?Problem Relation Age of Onset  ? Breast cancer Paternal Aunt   ? Breast cancer Paternal Grandmother   ? ?Family Psychiatric  History: See previous ?Social History:  ?Social History  ? ?Substance and Sexual Activity  ?Alcohol Use Not Currently  ?   ?Social History  ? ?Substance and Sexual Activity  ?Drug Use Yes  ? Types: Marijuana  ?  ?Social History  ? ?Socioeconomic History  ? Marital status: Soil scientist  ?  Spouse name: Not on file  ? Number of children: Not on file  ? Years of education: Not on file  ? Highest education level: Not on file  ?Occupational History  ? Not on file  ?Tobacco Use  ? Smoking  status: Every Day  ? Smokeless tobacco: Not on file  ?Substance and Sexual Activity  ? Alcohol use: Not Currently  ? Drug use: Yes  ?  Types: Marijuana  ? Sexual activity: Yes  ?Other Topics Concern  ? Not on file  ?Social History Narrative  ? Not on file  ? ?Social Determinants of Health  ? ?Financial Resource Strain: Not on file  ?Food Insecurity: Not on file  ?Transportation Needs: Not on file  ?Physical Activity: Not on file  ?Stress: Not on file  ?Social Connections: Not on file  ? ?Additional Social History:  ?  ?  ?  ?  ?  ?  ?  ?  ?  ?  ?  ? ?Sleep: Fair ? ?Appetite:  Fair ? ?Current Medications: ?Current Facility-Administered Medications  ?Medication Dose Route Frequency Provider Last Rate Last Admin  ? acetaminophen (TYLENOL) tablet 650 mg  650 mg Oral Q6H PRN Caroline Sauger, NP      ? alum & mag hydroxide-simeth (MAALOX/MYLANTA) 200-200-20 MG/5ML suspension 30 mL  30 mL Oral Q4H PRN Caroline Sauger, NP      ? diphenhydrAMINE (BENADRYL) capsule 50 mg  50 mg Oral QHS PRN Prophet Renwick, Madie Reno, MD      ? docusate sodium (COLACE) capsule 100 mg  100 mg Oral BID Rachal Dvorsky, Madie Reno, MD      ? hydrOXYzine (ATARAX) tablet 50 mg  50 mg Oral Q6H PRN Jakiyah Stepney  T, MD      ? lisinopril (ZESTRIL) tablet 10 mg  10 mg Oral Daily Caroline Sauger, NP   10 mg at 09/20/21 8756  ? lithium carbonate (ESKALITH) CR tablet 450 mg  450 mg Oral Q12H Nohea Kras T, MD      ? magnesium hydroxide (MILK OF MAGNESIA) suspension 30 mL  30 mL Oral Daily PRN Caroline Sauger, NP      ? psyllium (HYDROCIL/METAMUCIL) 1 packet  1 packet Oral Daily Keyle Doby, Madie Reno, MD      ? ? ?Lab Results:  ?Results for orders placed or performed during the hospital encounter of 09/18/21 (from the past 48 hour(s))  ?Comprehensive metabolic panel     Status: None  ? Collection Time: 09/18/21  5:25 PM  ?Result Value Ref Range  ? Sodium 137 135 - 145 mmol/L  ? Potassium 3.8 3.5 - 5.1 mmol/L  ? Chloride 102 98 - 111 mmol/L  ? CO2 26 22 - 32  mmol/L  ? Glucose, Bld 88 70 - 99 mg/dL  ?  Comment: Glucose reference range applies only to samples taken after fasting for at least 8 hours.  ? BUN 11 6 - 20 mg/dL  ? Creatinine, Ser 0.61 0.44 - 1.00 mg/dL  ? Calcium 9.1 8.9 - 10.3 mg/dL  ? Total Protein 7.1 6.5 - 8.1 g/dL  ? Albumin 4.0 3.5 - 5.0 g/dL  ? AST 15 15 - 41 U/L  ? ALT 14 0 - 44 U/L  ? Alkaline Phosphatase 48 38 - 126 U/L  ? Total Bilirubin 0.3 0.3 - 1.2 mg/dL  ? GFR, Estimated >60 >60 mL/min  ?  Comment: (NOTE) ?Calculated using the CKD-EPI Creatinine Equation (2021) ?  ? Anion gap 9 5 - 15  ?  Comment: Performed at [facility], [address], [place], [place] [address]  ?Ethanol     Status: None  ? Collection Time: 09/18/21  5:25 PM  ?Result Value Ref Range  ? Alcohol, Ethyl (B) <10 <10 mg/dL  ?  Comment: (NOTE) ?Lowest detectable limit for serum alcohol is 10 mg/dL. ? ?For medical purposes only. ?Performed at [facility], [place], ?[place] [address] ?  ?Salicylate level     Status: Abnormal  ? Collection Time: 09/18/21  5:25 PM  ?Result Value Ref Range  ? Salicylate Lvl <1.6 (L) 7.0 - 30.0 mg/dL  ?  Comment: Performed at [facility], [address], [place], [place] [address]  ?Acetaminophen level     Status: Abnormal  ? Collection Time: 09/18/21  5:25 PM  ?Result Value Ref Range  ? Acetaminophen (Tylenol), Serum <10 (L) 10 - 30 ug/mL  ?  Comment: (NOTE) ?Therapeutic concentrations vary significantly. A range of 10-30 ug/mL  ?may be an effective concentration for many patients. However, some  ?are best treated at concentrations outside of this range. ?Acetaminophen concentrations >150 ug/mL at 4 hours after ingestion  ?and >50 ug/mL at 12 hours after ingestion are often associated with  ?toxic reactions. ? ?Performed at [facility], [place], ?[place] [address] ?  ?cbc     Status: None  ? Collection Time: 09/18/21  5:25 PM  ?Result Value Ref Range  ? WBC 9.4 4.0 - 10.5 K/uL   ? RBC 4.87 3.87 - 5.11 MIL/uL  ? Hemoglobin 14.8 12.0 - 15.0 g/dL  ? HCT 45.4 36.0 - 46.0 %  ? MCV 93.2 80.0 - 100.0 fL  ? MCH  30.4 26.0 - 34.0 pg  ? MCHC 32.6 30.0 - 36.0 g/dL  ? RDW 12.5 11.5 - 15.5 %  ? Platelets 281 150 - 400 K/uL  ? nRBC 0.0 0.0 - 0.2 %  ?  Comment: Performed at [facility], [address], [place], [place] [address]  ?Urine Drug Screen, Qualitative     Status: Abnormal  ? Collection Time: 09/18/21  5:25 PM  ?Result Value Ref Range  ? Tricyclic, Ur Screen POSITIVE (A) NONE DETECTED  ? Amphetamines, Ur Screen NONE DETECTED NONE DETECTED  ? MDMA (Ecstasy)Ur Screen NONE DETECTED NONE DETECTED  ? Cocaine Metabolite,Ur [place] NONE DETECTED NONE DETECTED  ? Opiate, Ur Screen NONE DETECTED NONE DETECTED  ? Phencyclidine (PCP) Ur S NONE DETECTED NONE DETECTED  ? Cannabinoid 50 Ng, Ur [place] POSITIVE (A) NONE DETECTED  ? Barbiturates, Ur Screen NONE DETECTED NONE DETECTED  ? Benzodiazepine, Ur Scrn NONE DETECTED NONE DETECTED  ? Methadone Scn, Ur NONE DETECTED NONE DETECTED  ?  Comment: (NOTE) ?Tricyclics + metabolites, urine    Cutoff 1000 ng/mL ?Amphetamines + metabolites, urine  Cutoff 1000 ng/mL ?MDMA (Ecstasy), urine              Cutoff 500 ng/mL ?Cocaine Metabolite, urine          Cutoff 300 ng/mL ?Opiate + metabolites, urine        Cutoff 300 ng/mL ?Phencyclidine (PCP), urine         Cutoff 25 ng/mL ?Cannabinoid, urine                 Cutoff 50 ng/mL ?Barbiturates + metabolites, urine  Cutoff 200 ng/mL ?Benzodiazepine, urine              Cutoff 200 ng/mL ?Methadone, urine                   Cutoff 300 ng/mL ? ?The urine drug screen provides only a preliminary, unconfirmed ?analytical test result and should not be used for non-medical ?purposes. Clinical consideration and professional judgment should ?be applied to any positive drug screen result due to possible ?interfering substances. A more specific alternate chemical method ?must be used in order to obtain a confirmed analytical  result. ?Gas chromatography / mass spectrometry (GC/MS) is the preferred ?confirm atory method. ?Performed at [facility], [place], ?[place] [address] ?  ?Pregnancy, urine     Status: None  ? Col

## 2021-09-20 NOTE — Progress Notes (Signed)
Pt asked for medication to help her sleep, but she would not take the benadryl, she wanted to argue about it being the correct order. Pt stated I want the melatonin first and call the doctor now. Pt became demanding and oppositional toward the nurse. Pt then came to the nurse's and asked for her antianxiety medication. When I brought it to her she stated, "that's not my medication, I don't take atarax" and you are suppose to give me my medicate in the medication room. Pt looked in the cup and then said, " I don't know what this is." RN showed the pt her medication according to mar and took out a  closed atarax pill. She then took the medication. Pt continue to argue and she said, "I need for you all to follow policy." Pt was intrusive when the RN was giving another pt medication, standing outside the door and she had to be redirect to move on. ? ?

## 2021-09-20 NOTE — BH IP Treatment Plan (Signed)
Interdisciplinary Treatment and Diagnostic Plan Update ? ?09/20/2021 ?Time of Session: 9:40AM ?Anne Hendrix ?MRN: [id number] ? ?Principal Diagnosis: Bipolar affective disorder, manic, moderate ([place]) ? ?Secondary Diagnoses: Principal Problem: ?  Bipolar affective disorder, manic, moderate ([place]) ?Active Problems: ?  Bipolar 1 disorder ([place]) ?  Cannabis abuse ? ? ?Current Medications:  ?Current Facility-Administered Medications  ?Medication Dose Route Frequency Provider Last Rate Last Admin  ? acetaminophen (TYLENOL) tablet 650 mg  650 mg Oral Q6H PRN Caroline Sauger, NP      ? alum & mag hydroxide-simeth (MAALOX/MYLANTA) 200-200-20 MG/5ML suspension 30 mL  30 mL Oral Q4H PRN Caroline Sauger, NP      ? diphenhydrAMINE (BENADRYL) capsule 50 mg  50 mg Oral QHS PRN Clapacs, Madie Reno, MD      ? hydrOXYzine (ATARAX) tablet 25 mg  25 mg Oral TID PRN Caroline Sauger, NP   25 mg at 09/20/21 0101  ? lisinopril (ZESTRIL) tablet 10 mg  10 mg Oral Daily Caroline Sauger, NP   10 mg at 09/20/21 6073  ? lithium carbonate (LITHOBID) CR tablet 300 mg  300 mg Oral Q12H Clapacs, John T, MD   300 mg at 09/20/21 7106  ? magnesium hydroxide (MILK OF MAGNESIA) suspension 30 mL  30 mL Oral Daily PRN Caroline Sauger, NP      ? ?PTA Medications: ?Medications Prior to Admission  ?Medication Sig Dispense Refill Last Dose  ? busPIRone (BUSPAR) 10 MG tablet Take 10 mg by mouth every 12 (twelve) hours as needed for anxiety.     ? diphenhydramine-acetaminophen (TYLENOL PM) 25-500 MG TABS tablet Take 1 tablet by mouth at bedtime as needed.     ? escitalopram (LEXAPRO) 20 MG tablet Take 20 mg by mouth daily.     ? hydrOXYzine (ATARAX) 25 MG tablet Take 25 mg by mouth 3 (three) times daily as needed for anxiety.     ? lisinopril (ZESTRIL) 10 MG tablet Take 10 mg by mouth daily.     ? melatonin 5 MG TABS Take 10 mg by mouth at bedtime.     ? OLANZapine (ZYPREXA) 10 MG tablet Take 10 mg by mouth at bedtime.     ? OLANZapine  (ZYPREXA) 2.5 MG tablet Take 2.5 mg by mouth in the morning.     ? traZODone (DESYREL) 50 MG tablet Take 50 mg by mouth at bedtime.     ? ? ?Patient Stressors: Marital or family conflict   ?Substance abuse   ? ?Patient Strengths: Average or above average intelligence  ?Capable of independent living  ?Communication skills  ?General fund of knowledge  ?Motivation for treatment/growth  ? ?Treatment Modalities: Medication Management, Group therapy, Case management,  ?1 to 1 session with clinician, Psychoeducation, Recreational therapy. ? ? ?Physician Treatment Plan for Primary Diagnosis: Bipolar affective disorder, manic, moderate ([place]) ?Long Term Goal(s): Improvement in symptoms so as ready for discharge  ? ?Short Term Goals: Ability to maintain clinical measurements within normal limits will improve ?Ability to identify triggers associated with substance abuse/mental health issues will improve ?Ability to verbalize feelings will improve ?Ability to demonstrate self-control will improve ?Compliance with prescribed medications will improve ? ?Medication Management: Evaluate patient's response, side effects, and tolerance of medication regimen. ? ?Therapeutic Interventions: 1 to 1 sessions, Unit Group sessions and Medication administration. ? ?Evaluation of Outcomes: Not Met ? ?Physician Treatment Plan for Secondary Diagnosis: Principal Problem: ?  Bipolar affective disorder, manic, moderate ([place]) ?Active Problems: ?  Bipolar 1 disorder ([place]) ?  Cannabis abuse ? ?Long Term Goal(s): Improvement in symptoms so as ready for discharge  ? ?Short Term Goals: Ability to maintain clinical measurements within normal limits will improve ?Ability to identify triggers associated with substance abuse/mental health issues will improve ?Ability to verbalize feelings will improve ?Ability to demonstrate self-control will improve ?Compliance with prescribed medications will improve    ? ?Medication Management: Evaluate patient's  response, side effects, and tolerance of medication regimen. ? ?Therapeutic Interventions: 1 to 1 sessions, Unit Group sessions and Medication administration. ? ?Evaluation of Outcomes: Not Met ? ? ?RN Treatment Plan for Primary Diagnosis: Bipolar affective disorder, manic, moderate ([place]) ?Long Term Goal(s): Knowledge of disease and therapeutic regimen to maintain health will improve ? ?Short Term Goals: Ability to remain free from injury will improve, Ability to verbalize frustration and anger appropriately will improve, Ability to demonstrate self-control, Ability to participate in decision making will improve, Ability to verbalize feelings will improve, Ability to disclose and discuss suicidal ideas, Ability to identify and develop effective coping behaviors will improve, and Compliance with prescribed medications will improve ? ?Medication Management: RN will administer medications as ordered by provider, will assess and evaluate patient's response and provide education to patient for prescribed medication. RN will report any adverse and/or side effects to prescribing provider. ? ?Therapeutic Interventions: 1 on 1 counseling sessions, Psychoeducation, Medication administration, Evaluate responses to treatment, Monitor vital signs and CBGs as ordered, Perform/monitor CIWA, COWS, AIMS and Fall Risk screenings as ordered, Perform wound care treatments as ordered. ? ?Evaluation of Outcomes: Not Met ? ? ?LCSW Treatment Plan for Primary Diagnosis: Bipolar affective disorder, manic, moderate ([place]) ?Long Term Goal(s): Safe transition to appropriate next level of care at discharge, Engage patient in therapeutic group addressing interpersonal concerns. ? ?Short Term Goals: Engage patient in aftercare planning with referrals and resources, Increase social support, Increase ability to appropriately verbalize feelings, Increase emotional regulation, Facilitate acceptance of mental health diagnosis and concerns, Facilitate  patient progression through stages of change regarding substance use diagnoses and concerns, Identify triggers associated with mental health/substance abuse issues, and Increase skills for wellness and recovery ? ?Therapeutic Interventions: Assess for all discharge needs, 1 to 1 time with Education officer, museum, Explore available resources and support systems, Assess for adequacy in community support network, Educate family and significant other(s) on suicide prevention, Complete Psychosocial Assessment, Interpersonal group therapy. ? ?Evaluation of Outcomes: Not Met ? ? ?Progress in Treatment: ?Attending groups: Yes. ?Participating in groups: Yes. ?Taking medication as prescribed: Yes. ?Toleration medication: Yes. ?Family/Significant other contact made: No, will contact:  pt declined collateral/familial contact. ?Patient understands diagnosis: Yes. ?Discussing patient identified problems/goals with staff: Yes. ?Medical problems stabilized or resolved: Yes. ?Denies suicidal/homicidal ideation: Yes. ?Issues/concerns per patient self-inventory: No. ?Other: none. ? ?New problem(s) identified: No, Describe:  none reported ? ?New Short Term/Long Term Goal(s): detox, elimination of symptoms of psychosis, medication management for mood stabilization; elimination of SI thoughts; development of comprehensive mental wellness/sobriety plan. ? ?Patient Goals:  "Well I think I have a good plan for discharge. I'm supposed to go to [facility], that's who IVC'd me, was not able to see my regular physician." Pt goes on to express desire for discharge and feeling that this facility is not appropriate for her. ? ?Discharge Plan or Barriers: CSW will assist pt with development of an appropriate aftercare/discharge plan.  ? ?Reason for Continuation of Hospitalization: Delusions  ?Mania ?Medication stabilization ? ?Estimated Length of Stay: 5-7 days ? ?Last 3 [place] Suicide Severity Risk Score: ?  [facility] [place] [facility]mission (Current) from 09/19/2021 in [facility] from 09/18/2021 in [facility] ED from 09/15/2021 in [place]  ?C-SSRS RISK CATEGORY No Risk No Ris

## 2021-09-20 NOTE — Group Note (Signed)
[facility] LCSW Group Therapy Note ? ? ?Group Date: 09/20/2021 ?Start Time: 1300 ?End Time: 1400 ? ?Type of Therapy and Topic:  Group Therapy:  Feelings around Relapse and Recovery ? ?Participation Level:  Active  ? ?Mood: ? ?Description of Group:   ? Patients in this group will discuss emotions they experience before and after a relapse. They will process how experiencing these feelings, or avoidance of experiencing them, relates to having a relapse. Facilitator will guide patients to explore emotions they have related to recovery. Patients will be encouraged to process which emotions are more powerful. They will be guided to discuss the emotional reaction significant others in their lives may have to patients? relapse or recovery. Patients will be assisted in exploring ways to respond to the emotions of others without this contributing to a relapse. ? ?Therapeutic Goals: ?Patient will identify two or more emotions that lead to relapse for them:  ?Patient will identify two emotions that result when they relapse:  ?Patient will identify two emotions related to recovery:  ?Patient will demonstrate ability to communicate their needs through discussion and/or role plays. ? ? ?Summary of Patient Progress: ? ?Patient was present for the entirety of the group session. Patient was an active listener and participated in the topic of discussion, provided helpful advice to others, and added nuance to topic of conversation. Patient shared she uses nature to cope with her mental health. Also states she has been through extensive trauma focused CBT with her family to include intensive in-home therapy. Patient appears very inciteful into mental health condition and available treatment, thoguh remains noticeably bothered, neurotic, and emotionally labile.  ? ? ?Therapeutic Modalities:   ?Cognitive Behavioral Therapy ?Solution-Focused Therapy ?Assertiveness Training ?Relapse Prevention Therapy ? ? ?Corky Crafts, LCSWA ?

## 2021-09-20 NOTE — Progress Notes (Addendum)
Patient is alert and oriented x 4 affect is blunted and irritable, she was noted anxious and upset that the techs were making rounds and checking her room using a flash light. She was agitated came to the hallway yelling and screaming using profanities at staff she stated " using flashlight and opening my door gives me flashback" patient was offered emotional support and explained to her the rules of the unit and that 15 minutes rounds are for safety of the patients.   ?

## 2021-09-20 NOTE — Plan of Care (Signed)
D: Pt alert and oriented. Pt rates depression 4/10, hopelessness 10/10, and anxiety 10/10. Pt goal: "Going Home." Pt reports energy level as low and concentration as being poor. Pt reports sleep last night as being poor. Pt did receive medications for sleep and did not find them helpful. Pt denies experiencing any pain at this time. Pt denies experiencing any SI/HI, or AVH at this time.  ? ? ?A: Scheduled medications administered to pt, per MD orders. Support and encouragement provided. Frequent verbal contact made. Routine safety checks conducted q15 minutes.  ? ?R: No adverse drug reactions noted. Pt verbally contracts for safety at this time. Pt compliant with medications and treatment plan. Pt interacts poorly with others on the unit. Pt remains safe at this time. Will continue to monitor.  ? ?Problem: Education: ?Goal: Emotional status will improve ?Outcome: Not Progressing ?Goal: Mental status will improve ?Outcome: Not Progressing ?  ?

## 2021-09-21 DIAGNOSIS — F3112 Bipolar disorder, current episode manic without psychotic features, moderate: Secondary | ICD-10-CM | POA: Diagnosis not present

## 2021-09-21 NOTE — Progress Notes (Signed)
Pt rates depression now at 6/10 instead of 5/10 from this morning. She says that she thought that she might go home today. Also she does not think that her husband of 15 years believes in her "sickness" mentally. She questions rather he will be able to help her throughout with support. She has been calm and cooperative however appears anxious.  ?Torrie Mayers RN ?

## 2021-09-21 NOTE — Progress Notes (Addendum)
Deleted

## 2021-09-21 NOTE — Group Note (Signed)
LCSW Group Therapy Note ? ?Group Date: 09/21/2021 ?Start Time: 1300 ?End Time: 1400 ? ? ?Type of Therapy and Topic:  Group Therapy - How To Cope with Nervousness about Discharge  ? ?Participation Level:  Did Not Attend  ? ?Description of Group ?This process group involved identification of patients' feelings about discharge. Some of them are scheduled to be discharged soon, while others are new admissions, but each of them was asked to share thoughts and feelings surrounding discharge from the hospital. One common theme was that they are excited at the prospect of going home, while another was that many of them are apprehensive about sharing why they were hospitalized. Patients were given the opportunity to discuss these feelings with their peers in preparation for discharge. ? ?Therapeutic Goals ? ?Patient will identify their overall feelings about pending discharge. ?Patient will think about how they might proactively address issues that they believe will once again arise once they get home (i.e. with parents). ?Patients will participate in discussion about having hope for change. ? ? ?Summary of Patient Progress:   ?Group was not held due to staffing shortages; group will resume once staffing is adequate.  ? ?Therapeutic Modalities ?Cognitive Behavioral Therapy ? ? ?Anne Hendrix W Anne Hendrix, LCSWA ?09/21/2021  3:12 PM   ?
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
## 2021-09-21 NOTE — Plan of Care (Signed)
Pt rates depression 5/10 and anxiety 3/10. Pt denies SI, HI and AVH. Pt was educated on care plan and verbalizes understanding. Torrie Mayers RN ?Problem: Education: ?Goal: Knowledge of [facility] Education information/materials will improve ?Outcome: Progressing ?Goal: Emotional status will improve ?Outcome: Progressing ?Goal: Mental status will improve ?Outcome: Progressing ?Goal: Verbalization of understanding the information provided will improve ?Outcome: Progressing ?  ?Problem: Activity: ?Goal: Interest or engagement in activities will improve ?Outcome: Progressing ?Goal: Sleeping patterns will improve ?Outcome: Progressing ?  ?Problem: Coping: ?Goal: Ability to verbalize frustrations and anger appropriately will improve ?Outcome: Progressing ?Goal: Ability to demonstrate self-control will improve ?Outcome: Progressing ?  ?Problem: Health Behavior/Discharge Planning: ?Goal: Identification of resources available to assist in meeting health care needs will improve ?Outcome: Progressing ?Goal: Compliance with treatment plan for underlying cause of condition will improve ?Outcome: Progressing ?  ?Problem: Physical Regulation: ?Goal: Ability to maintain clinical measurements within normal limits will improve ?Outcome: Progressing ?  ?Problem: Safety: ?Goal: Periods of time without injury will increase ?Outcome: Progressing ?  ?Problem: Activity: ?Goal: Will verbalize the importance of balancing activity with adequate rest periods ?Outcome: Progressing ?  ?Problem: Education: ?Goal: Will be free of psychotic symptoms ?Outcome: Progressing ?Goal: Knowledge of the prescribed therapeutic regimen will improve ?Outcome: Progressing ?  ?Problem: Coping: ?Goal: Coping ability will improve ?Outcome: Progressing ?Goal: Will verbalize feelings ?Outcome: Progressing ?  ?Problem: Health Behavior/Discharge Planning: ?Goal: Compliance with prescribed medication regimen will improve ?Outcome: Progressing ?  ?Problem:  Nutritional: ?Goal: Ability to achieve adequate nutritional intake will improve ?Outcome: Progressing ?  ?Problem: Role Relationship: ?Goal: Ability to communicate needs accurately will improve ?Outcome: Progressing ?Goal: Ability to interact with others will improve ?Outcome: Progressing ?  ?Problem: Safety: ?Goal: Ability to redirect hostility and anger into socially appropriate behaviors will improve ?Outcome: Progressing ?Goal: Ability to remain free from injury will improve ?Outcome: Progressing ?  ?Problem: Self-Care: ?Goal: Ability to participate in self-care as condition permits will improve ?Outcome: Progressing ?  ?Problem: Self-Concept: ?Goal: Will verbalize positive feelings about self ?Outcome: Progressing ?  ?

## 2021-09-22 DIAGNOSIS — F3112 Bipolar disorder, current episode manic without psychotic features, moderate: Secondary | ICD-10-CM | POA: Diagnosis not present

## 2021-09-22 MED ORDER — LORAZEPAM 1 MG PO TABS
1.0000 mg | ORAL_TABLET | Freq: Once | ORAL | Status: AC
  Administered 2021-09-22: 1 mg via ORAL
  Filled 2021-09-22: qty 1
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
## 2021-09-22 NOTE — Progress Notes (Signed)
Patient is alert and oriented x 4 affect is blunted and irritable, she appears less anxious and receptive to staff. Patient denies SI/HI/AVH  she was offered emotional support and encouragement  15 minutes safety rounds maintained will continue to safely monitor.    ?

## 2021-09-22 NOTE — Plan of Care (Signed)
PT denies SI/ HI /AVH. No signs of distress or injury. Adherent with scheduled medications. Frequently seen in milieu interacting with peers. Staff will continue to observer for safety q15 min rounds. ? ? ?Problem: Education: ?Goal: Knowledge of [facility] Education information/materials will improve ?Outcome: Progressing ?Goal: Emotional status will improve ?Outcome: Progressing ?Goal: Mental status will improve ?Outcome: Progressing ?  ?

## 2021-09-22 NOTE — Group Note (Signed)
LCSW Group Therapy Note ? ?Group Date: 09/22/2021 ?Start Time: 1330 ?End Time: 1430 ? ? ?Type of Therapy and Topic:  Group Therapy - Healthy vs Unhealthy Coping Skills ? ?Participation Level:  Did Not Attend  ? ?Description of Group ?The focus of this group was to determine what unhealthy coping techniques typically are used by group members and what healthy coping techniques would be helpful in coping with various problems. Patients were guided in becoming aware of the differences between healthy and unhealthy coping techniques. Patients were asked to identify 2-3 healthy coping skills they would like to learn to use more effectively. ? ?Therapeutic Goals ?Patients learned that coping is what human beings do all day long to deal with various situations in their lives ?Patients defined and discussed healthy vs unhealthy coping techniques ?Patients identified their preferred coping techniques and identified whether these were healthy or unhealthy ?Patients determined 2-3 healthy coping skills they would like to become more familiar with and use more often. ?Patients provided support and ideas to each other ? ? ?Summary of Patient Progress:  Due to limited staffing, group was not held on the unit.  ? ? ?Therapeutic Modalities ?Cognitive Behavioral Therapy ?Motivational Interviewing ? ?Ileana Ladd Brylyn Novakovich, LCSWA ?09/22/2021  4:09 PM   ?

## 2021-09-23 DIAGNOSIS — F3112 Bipolar disorder, current episode manic without psychotic features, moderate: Secondary | ICD-10-CM | POA: Diagnosis not present

## 2021-09-23 MED ORDER — LORAZEPAM 0.5 MG PO TABS
0.5000 mg | ORAL_TABLET | Freq: Three times a day (TID) | ORAL | Status: DC
  Administered 2021-09-23 – 2021-09-24 (×3): 0.5 mg via ORAL
  Filled 2021-09-23 (×3): qty 1

## 2021-09-23 MED ORDER — MIRTAZAPINE 15 MG PO TABS
15.0000 mg | ORAL_TABLET | Freq: Every day | ORAL | Status: DC
  Administered 2021-09-23: 15 mg via ORAL
  Filled 2021-09-23: qty 1

## 2021-09-23 NOTE — Progress Notes (Signed)
Patient calm and pleasant during assessment denying SI/HI/AVH. Pt observed interacting appropriately with staff and peers on the unit. Pt compliant with medication administration per MD orders. Pt given education, support and encouragement to be active in her treatment plan. Pt being monitored Q 15 minutes for safety per unit protocol. Pt remains safe on the unit.  ?

## 2021-09-23 NOTE — Plan of Care (Signed)
?  Problem: Education: ?Goal: Knowledge of [facility] Education information/materials will improve ?Outcome: Progressing ?Goal: Emotional status will improve ?Outcome: Progressing ?Goal: Verbalization of understanding the information provided will improve ?Outcome: Progressing ?  ?Problem: Coping: ?Goal: Ability to demonstrate self-control will improve ?Outcome: Progressing ?  ?Problem: Health Behavior/Discharge Planning: ?Goal: Compliance with treatment plan for underlying cause of condition will improve ?Outcome: Progressing ?  ?Problem: Physical Regulation: ?Goal: Ability to maintain clinical measurements within normal limits will improve ?Outcome: Progressing ?  ?Problem: Safety: ?Goal: Periods of time without injury will increase ?Outcome: Progressing ?  ?Problem: Education: ?Goal: Will be free of psychotic symptoms ?Outcome: Progressing ?Goal: Knowledge of the prescribed therapeutic regimen will improve ?Outcome: Progressing ?  ?Problem: Nutritional: ?Goal: Ability to achieve adequate nutritional intake will improve ?Outcome: Progressing ?  ?Problem: Role Relationship: ?Goal: Ability to interact with others will improve ?Outcome: Progressing ?  ?Problem: Safety: ?Goal: Ability to remain free from injury will improve ?Outcome: Progressing ?  ?
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
## 2021-09-23 NOTE — Progress Notes (Signed)
D: Patient alert and oriented. Patient denies pain. Patient rates anxiety 3/10. Patient states this rating at time of assessment was "manageable". Patient denies SI/HI/AVH.  ?Patient interactive with peers.  ?Patient became tearful over the phone. ?Patient isolative to room and self before and after dinner. ? ?A: Scheduled medications administered to patient, per MD orders.  Support and encouragement provided to patient.  ?Q15 minute safety checks maintained.  ? ?R: Patient compliant with medication administration and treatment plan. No adverse drug reactions noted. Patient remains safe on the unit at this time.  ?

## 2021-09-23 NOTE — Group Note (Signed)
[facility] LCSW Group Therapy Note ? ? ? ?Group Date: 09/23/2021 ?Start Time: 1300 ?End Time: 1400 ? ?Type of Therapy and Topic:  Group Therapy:  Overcoming Obstacles ? ?Participation Level:  [facility] PARTICIPATION LEVEL: Did Not Attend ? ?Mood: ? ?Description of Group:   ?In this group patients will be encouraged to explore what they see as obstacles to their own wellness and recovery. They will be guided to discuss their thoughts, feelings, and behaviors related to these obstacles. The group will process together ways to cope with barriers, with attention given to specific choices patients can make. Each patient will be challenged to identify changes they are motivated to make in order to overcome their obstacles. This group will be process-oriented, with patients participating in exploration of their own experiences as well as giving and receiving support and challenge from other group members. ? ?Therapeutic Goals: ?1. Patient will identify personal and current obstacles as they relate to admission. ?2. Patient will identify barriers that currently interfere with their wellness or overcoming obstacles.  ?3. Patient will identify feelings, thought process and behaviors related to these barriers. ?4. Patient will identify two changes they are willing to make to overcome these obstacles:  ? ? ?Summary of Patient Progress ? ? ?CSW offered group, patient chose not to attend.  ? ? ?Therapeutic Modalities:   ?Cognitive Behavioral Therapy ?Solution Focused Therapy ?Motivational Interviewing ?Relapse Prevention Therapy ? ? ?Erby Sanderson J Jenna Ardoin, LCSW ?

## 2021-09-24 ENCOUNTER — Other Ambulatory Visit: Payer: Self-pay

## 2021-09-24 DIAGNOSIS — F3112 Bipolar disorder, current episode manic without psychotic features, moderate: Secondary | ICD-10-CM | POA: Diagnosis not present

## 2021-09-24 MED ORDER — HYDROXYZINE HCL 50 MG PO TABS
50.0000 mg | ORAL_TABLET | Freq: Four times a day (QID) | ORAL | 1 refills | Status: AC | PRN
Start: 2021-09-24 — End: ?
  Filled 2021-09-26: qty 60, 15d supply, fill #0
  Filled 2021-12-02: qty 60, 15d supply, fill #1
  Filled 2021-12-03: qty 60, 15d supply, fill #0

## 2021-09-24 MED ORDER — LISINOPRIL 10 MG PO TABS
10.0000 mg | ORAL_TABLET | Freq: Every day | ORAL | 0 refills | Status: DC
  Filled 2021-09-24: qty 7, 7d supply, fill #0

## 2021-09-24 MED ORDER — LISINOPRIL 10 MG PO TABS
10.0000 mg | ORAL_TABLET | Freq: Every day | ORAL | 1 refills | Status: DC
  Filled 2021-09-26: qty 30, 30d supply, fill #0

## 2021-09-24 MED ORDER — LITHIUM CARBONATE ER 450 MG PO TBCR
450.0000 mg | EXTENDED_RELEASE_TABLET | Freq: Two times a day (BID) | ORAL | 1 refills | Status: DC
  Filled 2021-09-26: qty 60, 30d supply, fill #0

## 2021-09-24 MED ORDER — PSYLLIUM 95 % PO PACK
1.0000 | PACK | Freq: Every day | ORAL | 0 refills | Status: DC
  Filled 2021-09-24: qty 7, 7d supply, fill #0

## 2021-09-24 MED ORDER — PSYLLIUM 95 % PO PACK: 1.0000 | PACK | Freq: Every day | ORAL | 1 refills | Status: DC

## 2021-09-24 MED ORDER — LITHIUM CARBONATE ER 450 MG PO TBCR
450.0000 mg | EXTENDED_RELEASE_TABLET | Freq: Two times a day (BID) | ORAL | 0 refills | Status: DC
  Filled 2021-09-24: qty 14, 7d supply, fill #0

## 2021-09-24 MED ORDER — HYDROXYZINE HCL 50 MG PO TABS
50.0000 mg | ORAL_TABLET | Freq: Four times a day (QID) | ORAL | 0 refills | Status: DC | PRN
Start: 2021-09-24 — End: 2021-09-24
  Filled 2021-09-24: qty 14, 4d supply, fill #0

## 2021-09-24 MED ORDER — DOCUSATE SODIUM 100 MG PO CAPS
100.0000 mg | ORAL_CAPSULE | Freq: Two times a day (BID) | ORAL | 0 refills | Status: DC
Start: 2021-09-24 — End: 2021-09-24
  Filled 2021-09-24: qty 20, 10d supply, fill #0

## 2021-09-24 MED ORDER — DOCUSATE SODIUM 100 MG PO CAPS: 100.0000 mg | ORAL_CAPSULE | Freq: Two times a day (BID) | ORAL | 1 refills | Status: DC

## 2021-09-24 NOTE — Progress Notes (Signed)
Recreation Therapy Notes ? ? ?Date: 09/24/2021 ? ?Time: 10:35 am  ? ?Location: Courtyard   ? ?Behavioral response: Appropriate ? ?Intervention Topic: Leisure   ? ?Discussion/Intervention:  ?Group content today was focused on leisure. The group defined what leisure is and some positive leisure activities they participate in. Individuals identified the difference between good and bad leisure. Participants expressed how they feel after participating in the leisure of their choice. The group discussed how they go about picking a leisure activity and if others are involved in their leisure activities. The patient stated how many leisure activities they have to choose from and reasons why it is important to have leisure time. Individuals participated in the intervention ?Exploration of Leisure? where they had a chance to identify new leisure activities as well as benefits of leisure. ?Clinical Observations/Feedback: ?Patient came to group and was able to focus on the task at hand while learning new leisure skills.  Individual was social with peers and staff while participating in the intervention.  ?Persephanie Laatsch LRT/CTRS  ? ? ? ? ? ? ? ? ? ? ?Leonna Schlee ?09/24/2021 1:06 PM ?

## 2021-09-24 NOTE — Plan of Care (Signed)
?  Problem: Education: ?Goal: Knowledge of [place] [facility] Education information/materials will improve ?09/24/2021 1033 by Hyman Hopes, RN ?Outcome: Adequate for Discharge ?09/24/2021 0930 by Hyman Hopes, RN ?Outcome: Progressing ?Goal: Emotional status will improve ?09/24/2021 1033 by Hyman Hopes, RN ?Outcome: Adequate for Discharge ?09/24/2021 0930 by Hyman Hopes, RN ?Outcome: Not Progressing ?Goal: Mental status will improve ?Outcome: Adequate for Discharge ?Goal: Verbalization of understanding the information provided will improve ?09/24/2021 1033 by Hyman Hopes, RN ?Outcome: Adequate for Discharge ?09/24/2021 0930 by Hyman Hopes, RN ?Outcome: Progressing ?  ?Problem: Activity: ?Goal: Interest or engagement in activities will improve ?Outcome: Adequate for Discharge ?Goal: Sleeping patterns will improve ?Outcome: Adequate for Discharge ?  ?Problem: Coping: ?Goal: Ability to verbalize frustrations and anger appropriately will improve ?Outcome: Adequate for Discharge ?Goal: Ability to demonstrate self-control will improve ?Outcome: Adequate for Discharge ?  ?Problem: Health Behavior/Discharge Planning: ?Goal: Identification of resources available to assist in meeting health care needs will improve ?Outcome: Adequate for Discharge ?Goal: Compliance with treatment plan for underlying cause of condition will improve ?09/24/2021 1033 by Hyman Hopes, RN ?Outcome: Adequate for Discharge ?09/24/2021 0930 by Hyman Hopes, RN ?Outcome: Progressing ?  ?Problem: Physical Regulation: ?Goal: Ability to maintain clinical measurements within normal limits will improve ?09/24/2021 1033 by Hyman Hopes, RN ?Outcome: Adequate for Discharge ?09/24/2021 0930 by Hyman Hopes, RN ?Outcome: Progressing ?  ?Problem: Safety: ?Goal: Periods of time without injury will increase ?09/24/2021 1033 by Hyman Hopes, RN ?Outcome: Adequate for Discharge ?09/24/2021 0930 by Hyman Hopes, RN ?Outcome:  Progressing ?  ?Problem: Activity: ?Goal: Will verbalize the importance of balancing activity with adequate rest periods ?Outcome: Adequate for Discharge ?  ?Problem: Education: ?Goal: Will be free of psychotic symptoms ?Outcome: Adequate for Discharge ?Goal: Knowledge of the prescribed therapeutic regimen will improve ?09/24/2021 1033 by Hyman Hopes, RN ?Outcome: Adequate for Discharge ?09/24/2021 0930 by Hyman Hopes, RN ?Outcome: Progressing ?  ?Problem: Coping: ?Goal: Coping ability will improve ?Outcome: Adequate for Discharge ?Goal: Will verbalize feelings ?Outcome: Adequate for Discharge ?  ?Problem: Health Behavior/Discharge Planning: ?Goal: Compliance with prescribed medication regimen will improve ?09/24/2021 1033 by Hyman Hopes, RN ?Outcome: Adequate for Discharge ?09/24/2021 0930 by Hyman Hopes, RN ?Outcome: Progressing ?  ?Problem: Nutritional: ?Goal: Ability to achieve adequate nutritional intake will improve ?Outcome: Adequate for Discharge ?  ?Problem: Role Relationship: ?Goal: Ability to communicate needs accurately will improve ?Outcome: Adequate for Discharge ?Goal: Ability to interact with others will improve ?Outcome: Adequate for Discharge ?  ?Problem: Safety: ?Goal: Ability to redirect hostility and anger into socially appropriate behaviors will improve ?Outcome: Adequate for Discharge ?Goal: Ability to remain free from injury will improve ?09/24/2021 1033 by Hyman Hopes, RN ?Outcome: Adequate for Discharge ?09/24/2021 0930 by Hyman Hopes, RN ?Outcome: Progressing ?  ?Problem: Self-Care: ?Goal: Ability to participate in self-care as condition permits will improve ?Outcome: Adequate for Discharge ?  ?Problem: Self-Concept: ?Goal: Will verbalize positive feelings about self ?Outcome: Adequate for Discharge ?  ?

## 2021-09-24 NOTE — Plan of Care (Signed)
?  Problem: Education: ?Goal: Knowledge of [place] [facility] Education information/materials will improve ?Outcome: Progressing ?Goal: Emotional status will improve ?Outcome: Not Progressing ?Goal: Verbalization of understanding the information provided will improve ?Outcome: Progressing ?  ?Problem: Health Behavior/Discharge Planning: ?Goal: Compliance with treatment plan for underlying cause of condition will improve ?Outcome: Progressing ?  ?Problem: Physical Regulation: ?Goal: Ability to maintain clinical measurements within normal limits will improve ?Outcome: Progressing ?  ?Problem: Safety: ?Goal: Periods of time without injury will increase ?Outcome: Progressing ?  ?Problem: Education: ?Goal: Knowledge of the prescribed therapeutic regimen will improve ?Outcome: Progressing ?  ?Problem: Health Behavior/Discharge Planning: ?Goal: Compliance with prescribed medication regimen will improve ?Outcome: Progressing ?  ?Problem: Safety: ?Goal: Ability to remain free from injury will improve ?Outcome: Progressing ?  ?

## 2021-09-24 NOTE — Group Note (Signed)
[facility] LCSW Group Therapy Note ? ? ? ?Group Date: 09/24/2021 ?Start Time: 1300 ?End Time: 1400 ? ?Type of Therapy and Topic:  Group Therapy:  Overcoming Obstacles ? ?Participation Level:  [facility] PARTICIPATION LEVEL: Active ? ?Affect: hypomanic  ? ?Description of Group:   ?In this group patients will be encouraged to explore what they see as obstacles to their own wellness and recovery. They will be guided to discuss their thoughts, feelings, and behaviors related to these obstacles. The group will process together ways to cope with barriers, with attention given to specific choices patients can make. Each patient will be challenged to identify changes they are motivated to make in order to overcome their obstacles. This group will be process-oriented, with patients participating in exploration of their own experiences as well as giving and receiving support and challenge from other group members. ? ?Therapeutic Goals: ?1. Patient will identify personal and current obstacles as they relate to admission. ?2. Patient will identify barriers that currently interfere with their wellness or overcoming obstacles.  ?3. Patient will identify feelings, thought process and behaviors related to these barriers. ?4. Patient will identify two changes they are willing to make to overcome these obstacles:  ? ? ?Summary of Patient Progress ?Patient was present for the entirety of the group session. Patient was an active listener and participated in the topic of discussion, provided helpful advice to others, and added nuance to topic of conversation. Patient shared she has difficulty with partner understanding her mental health conditions.  ? ? ?Therapeutic Modalities:   ?Cognitive Behavioral Therapy ?Solution Focused Therapy ?Motivational Interviewing ?Relapse Prevention Therapy ? ? ?Corky Crafts, LCSWA ?

## 2021-09-24 NOTE — Progress Notes (Signed)
Patient ID: Anne Hendrix, female   DOB: 1980/06/26, 41 y.o.   MRN: [id number] ?Patient denies SI/HI/AVH. Belongings were returned to patient. Discharge instructions  including medication and follow up information were reviewed with patient and understanding was verbalized. Patient was not observed to be in distress at time of discharge. Patient was escorted out with staff to medical mall to be transported home by family member. ?

## 2021-09-24 NOTE — Discharge Summary (Signed)
Physician Discharge Summary Note ? ?Patient:  Anne Hendrix is an 41 y.o., female ?MRN:  [id number] ?DOB:  [birth year] ?Patient phone:  [phone] (home)  ?Patient address:   ?[address] ?[place] [address],  ?Total Time spent with patient: 30 minutes ? ?Date of Admission:  09/19/2021 ?Date of Discharge: 09/24/2021 ? ?Reason for Admission: Patient was admitted with symptoms consistent with mania with hyper verbality paranoia agitation and anxiety and reports of threatening behavior ? ?Principal Problem: Bipolar affective disorder, manic, moderate (HCC) ?Discharge Diagnoses: Principal Problem: ?  Bipolar affective disorder, manic, moderate (HCC) ?Active Problems: ?  Bipolar 1 disorder (HCC) ?  Cannabis abuse ? ? ?Past Psychiatric History: Past history of depression and anxiety ? ?Past Medical History:  ?Past Medical History:  ?Diagnosis Date  ? Herpes   ? Migraine   ?  ?Past Surgical History:  ?Procedure Laterality Date  ? TUBAL LIGATION    ? uterine ablation    ? ?Family History:  ?Family History  ?Problem Relation Age of Onset  ? Breast cancer Paternal Aunt   ? Breast cancer Paternal Grandmother   ? ?Family Psychiatric  History: See previous notes ?Social History:  ?Social History  ? ?Substance and Sexual Activity  ?Alcohol Use Not Currently  ?   ?Social History  ? ?Substance and Sexual Activity  ?Drug Use Yes  ? Types: Marijuana  ?  ?Social History  ? ?Socioeconomic History  ? Marital status: Media planner  ?  Spouse name: Not on file  ? Number of children: Not on file  ? Years of education: Not on file  ? Highest education level: Not on file  ?Occupational History  ? Not on file  ?Tobacco Use  ? Smoking status: Every Day  ? Smokeless tobacco: Not on file  ?Substance and Sexual Activity  ? Alcohol use: Not Currently  ? Drug use: Yes  ?  Types: Marijuana  ? Sexual activity: Yes  ?Other Topics Concern  ? Not on file  ?Social History Narrative  ? Not on file  ? ?Social Determinants of Health  ? ?Financial  Resource Strain: Not on file  ?Food Insecurity: Not on file  ?Transportation Needs: Not on file  ?Physical Activity: Not on file  ?Stress: Not on file  ?Social Connections: Not on file  ? ? ?Hospital Course: Admitted to psychiatric hospital.  15-minute checks continued.  Patient was initially on olanzapine but after discussion with her I suggested we try her on lithium carbonate as a sole agent.  She has tolerated medication at 450 mg twice a day.  Patient's mood has improved although she is still a little irritable she has not shown any dangerous violent or threatening behaviors.  Shows better insight.  Denies suicidal or homicidal ideation.  Agrees to follow-up at our HA.  Patient will be discharged on current medication and given a 7-day supply with follow-up arranged ? ?Physical Findings: ?AIMS:  , ,  ,  ,    ?CIWA:    ?COWS:    ? ?Musculoskeletal: ?Strength & Muscle Tone: within normal limits ?Gait & Station: normal ?Patient leans: N/A ? ? ?Psychiatric Specialty Exam: ? ?Presentation  ?General Appearance: Appropriate for Environment ? ?Eye Contact:Good ? ?Speech:Pressured ? ?Speech Volume:Increased ? ?Handedness:Right ? ? ?Mood and Affect  ?Mood:No data recorded ?Affect:No data recorded ? ?Thought Process  ?Thought Processes:Linear ? ?Descriptions of Associations:Loose ? ?Orientation:Full (Time, Place and Person) ? ?Thought Content:Logical; Paranoid Ideation; Rumination; Tangential ? ?History of Schizophrenia/Schizoaffective disorder:No ? ?Duration  of Psychotic Symptoms:Greater than six months ? ?Hallucinations:No data recorded ?Ideas of Reference:None ? ?Suicidal Thoughts:No data recorded ?Homicidal Thoughts:No data recorded ? ?Sensorium  ?Memory:Immediate Good; Recent Good; Remote Good ? ?Judgment:Poor ? ?Insight:Poor ? ? ?Executive Functions  ?Concentration:Fair ? ?Attention Span:Fair ? ?Recall:Fair ? ?Fund of Knowledge:Fair ? ?Language:Good ? ? ?Psychomotor Activity  ?Psychomotor Activity:No data  recorded ? ?Assets  ?Assets:Communication Skills; Desire for Improvement; Financial Resources/Insurance; Leisure Time; Resilience; Social Support ? ? ?Sleep  ?Sleep:No data recorded ? ? ?Physical Exam: ?Physical Exam ?Vitals and nursing note reviewed.  ?Constitutional:   ?   Appearance: Normal appearance.  ?HENT:  ?   Head: Normocephalic and atraumatic.  ?   Mouth/Throat:  ?   Pharynx: Oropharynx is clear.  ?Eyes:  ?   Pupils: Pupils are equal, round, and reactive to light.  ?Cardiovascular:  ?   Rate and Rhythm: Normal rate and regular rhythm.  ?Pulmonary:  ?   Effort: Pulmonary effort is normal.  ?   Breath sounds: Normal breath sounds.  ?Abdominal:  ?   General: Abdomen is flat.  ?   Palpations: Abdomen is soft.  ?Musculoskeletal:     ?   General: Normal range of motion.  ?Skin: ?   General: Skin is warm and dry.  ?Neurological:  ?   General: No focal deficit present.  ?   Mental Status: She is alert. Mental status is at baseline.  ?Psychiatric:     ?   Attention and Perception: Attention normal.     ?   Mood and Affect: Mood normal.     ?   Speech: Speech normal.     ?   Behavior: Behavior normal.     ?   Thought Content: Thought content normal.     ?   Cognition and Memory: Cognition normal.     ?   Judgment: Judgment normal.  ? ?Review of Systems  ?Constitutional: Negative.   ?HENT: Negative.    ?Eyes: Negative.   ?Respiratory: Negative.    ?Cardiovascular: Negative.   ?Gastrointestinal: Negative.   ?Musculoskeletal: Negative.   ?Skin: Negative.   ?Neurological: Negative.   ?Psychiatric/Behavioral: Negative.    ?Blood pressure 106/84, pulse 77, temperature 98.6 ?F (37 ?C), temperature source Oral, resp. rate 18, height 5\' 5"  (1.651 m), weight 52.2 kg, SpO2 100 %. Body mass index is 19.14 kg/m?. ? ? ?Social History  ? ?Tobacco Use  ?Smoking Status Every Day  ?Smokeless Tobacco Not on file  ? ?Tobacco Cessation:  A prescription for an FDA-approved tobacco cessation medication was offered at discharge and the  patient refused ? ? ?Blood Alcohol level:  ?Lab Results  ?Component Value Date  ? ETH <10 09/18/2021  ? ETH <10 09/15/2021  ? ? ?Metabolic Disorder Labs:  ?No results found for: HGBA1C, MPG ?No results found for: PROLACTIN ?No results found for: CHOL, TRIG, HDL, CHOLHDL, VLDL, LDLCALC ? ?See Psychiatric Specialty Exam and Suicide Risk Assessment completed by Attending Physician prior to discharge. ? ?Discharge destination:  Home ? ?Is patient on multiple antipsychotic therapies at discharge:  No   ?Has Patient had three or more failed trials of antipsychotic monotherapy by history:  No ? ?Recommended Plan for Multiple Antipsychotic Therapies: ?NA ? ?Discharge Instructions   ? ? Diet - low sodium heart healthy   Complete by: As directed ?  ? Increase activity slowly   Complete by: As directed ?  ? ?  ? ?Allergies as of 09/24/2021   ?No Known  Allergies ?  ? ?  ?Medication List  ?  ? ?STOP taking these medications   ? ?busPIRone 10 MG tablet ?Commonly known as: BUSPAR ?  ?diphenhydramine-acetaminophen 25-500 MG Tabs tablet ?Commonly known as: TYLENOL PM ?  ?escitalopram 20 MG tablet ?Commonly known as: LEXAPRO ?  ?melatonin 5 MG Tabs ?  ?OLANZapine 10 MG tablet ?Commonly known as: ZYPREXA ?  ?OLANZapine 2.5 MG tablet ?Commonly known as: ZYPREXA ?  ?traZODone 50 MG tablet ?Commonly known as: DESYREL ?  ? ?  ? ?TAKE these medications   ? ?  Indication  ?docusate sodium 100 MG capsule ?Commonly known as: COLACE ?Take 1 capsule (100 mg total) by mouth 2 (two) times daily. ? Indication: Constipation ?  ?hydrOXYzine 50 MG tablet ?Commonly known as: ATARAX ?Take 1 tablet (50 mg total) by mouth every 6 (six) hours as needed for anxiety. ?What changed:  ?medication strength ?how much to take ?when to take this ? Indication: Feeling Anxious ?  ?lisinopril 10 MG tablet ?Commonly known as: ZESTRIL ?Take 1 tablet (10 mg total) by mouth daily. ? Indication: High Blood Pressure Disorder ?  ?lithium carbonate 450 MG CR tablet ?Commonly  known as: ESKALITH ?Take 1 tablet (450 mg total) by mouth every 12 (twelve) hours. ? Indication: Manic-Depression ?  ?psyllium 95 % Pack ?Commonly known as: HYDROCIL/METAMUCIL ?Take 1 packet by mouth daily. ?Start ta

## 2021-09-24 NOTE — [facility] Suicide Risk Assessment (Signed)
[facility] Discharge Suicide Risk Assessment ? ? ?Principal Problem: Bipolar affective disorder, manic, moderate (HCC) ?Discharge Diagnoses: Principal Problem: ?  Bipolar affective disorder, manic, moderate (HCC) ?Active Problems: ?  Bipolar 1 disorder (HCC) ?  Cannabis abuse ? ? ?Total Time spent with patient: 30 minutes ? ?Musculoskeletal: ?Strength & Muscle Tone: within normal limits ?Gait & Station: normal ?Patient leans: N/A ? ?Psychiatric Specialty Exam ? ?Presentation  ?General Appearance: Appropriate for Environment ? ?Eye Contact:Good ? ?Speech:Pressured ? ?Speech Volume:Increased ? ?Handedness:Right ? ? ?Mood and Affect  ?Mood:No data recorded ?Duration of Depression Symptoms: Greater than two weeks ? ?Affect:No data recorded ? ?Thought Process  ?Thought Processes:Linear ? ?Descriptions of Associations:Loose ? ?Orientation:Full (Time, Place and Person) ? ?Thought Content:Logical; Paranoid Ideation; Rumination; Tangential ? ?History of Schizophrenia/Schizoaffective disorder:No ? ?Duration of Psychotic Symptoms:Greater than six months ? ?Hallucinations:No data recorded ?Ideas of Reference:None ? ?Suicidal Thoughts:No data recorded ?Homicidal Thoughts:No data recorded ? ?Sensorium  ?Memory:Immediate Good; Recent Good; Remote Good ? ?Judgment:Poor ? ?Insight:Poor ? ? ?Executive Functions  ?Concentration:Fair ? ?Attention Span:Fair ? ?Recall:Fair ? ?Fund of Knowledge:Fair ? ?Language:Good ? ? ?Psychomotor Activity  ?Psychomotor Activity:No data recorded ? ?Assets  ?Assets:Communication Skills; Desire for Improvement; Financial Resources/Insurance; Leisure Time; Resilience; Social Support ? ? ?Sleep  ?Sleep:No data recorded ? ?Physical Exam: ?Physical Exam ?Vitals and nursing note reviewed.  ?Constitutional:   ?   Appearance: Normal appearance.  ?HENT:  ?   Head: Normocephalic and atraumatic.  ?   Mouth/Throat:  ?   Pharynx: Oropharynx is clear.  ?Eyes:  ?   Pupils: Pupils are equal, round, and reactive to light.   ?Cardiovascular:  ?   Rate and Rhythm: Normal rate and regular rhythm.  ?Pulmonary:  ?   Effort: Pulmonary effort is normal.  ?   Breath sounds: Normal breath sounds.  ?Abdominal:  ?   General: Abdomen is flat.  ?   Palpations: Abdomen is soft.  ?Musculoskeletal:     ?   General: Normal range of motion.  ?Skin: ?   General: Skin is warm and dry.  ?Neurological:  ?   General: No focal deficit present.  ?   Mental Status: She is alert. Mental status is at baseline.  ?Psychiatric:     ?   Attention and Perception: Attention normal.     ?   Mood and Affect: Mood normal.     ?   Speech: Speech normal.     ?   Behavior: Behavior is cooperative.     ?   Thought Content: Thought content normal.     ?   Cognition and Memory: Cognition normal.     ?   Judgment: Judgment normal.  ? ?Review of Systems  ?Constitutional: Negative.   ?HENT: Negative.    ?Eyes: Negative.   ?Respiratory: Negative.    ?Cardiovascular: Negative.   ?Gastrointestinal: Negative.   ?Musculoskeletal: Negative.   ?Skin: Negative.   ?Neurological: Negative.   ?Psychiatric/Behavioral: Negative.    ?Blood pressure 106/84, pulse 77, temperature 98.6 ?F (37 ?C), temperature source Oral, resp. rate 18, height 5\' 5"  (1.651 m), weight 52.2 kg, SpO2 100 %. Body mass index is 19.14 kg/m?. ? ?Mental Status Per Nursing Assessment::   ?On Admission:  NA ? ?Demographic Factors:  ?Caucasian ? ?Loss Factors: ?NA ? ?Historical Factors: ?NA ? ?Risk Reduction Factors:   ?Living with another person, especially a relative and Positive therapeutic relationship ? ?Continued Clinical Symptoms:  ?Bipolar Disorder:   Mixed State ? ?Cognitive Features  That Contribute To Risk:  ?None   ? ?Suicide Risk:  ?Minimal: No identifiable suicidal ideation.  Patients presenting with no risk factors but with morbid ruminations; may be classified as minimal risk based on the severity of the depressive symptoms ? ? Follow-up Information   ? ? [facility] Go to.   ?Why: Please meet  with Lorella Nimrod, peer support, at [facility] on Wed 5/3 at 0730AM. ?Contact information: ?[address] ?[place] [address] ?[phone] ? ? ?  ?  ? ?  ?  ? ?  ? ? ?Plan Of Care/Follow-up recommendations:  ?Follow-up with our [facility].  Continue current medicine.  Avoid drug abuse.  Patient is denying all suicidal ideation. ? ?Mordecai Rasmussen, MD ?09/24/2021, 10:19 AM ?

## 2021-09-24 NOTE — Progress Notes (Signed)
?  [facility] Adult Case Management Discharge Plan : ? ?Will you be returning to the same living situation after discharge:  Yes,  Patient to return to place of residence living with partner and children.  ? ?At discharge, do you have transportation home?: Yes,  Patient's family to assist with transportation. ? ?Do you have the ability to pay for your medications: No. No insurance listed, patient to be supplied with 7 days of medication.  ? ?Release of information consent forms completed and in the chart;  Patient's signature needed at discharge. ? ?Patient to Follow up at: ? Follow-up Information   ? ? [facility] Go to.   ?Why: Please meet with Lorella Nimrod, peer support, at [facility] on Wed 5/3 at 0730AM. ?Contact information: ?[address] ?[place] [address] ?[phone] ? ? ?  ?  ? ?  ?  ? ?  ? ? ?Next level of care provider has access to [facility] Link:no ? ?Safety Planning and Suicide Prevention discussed: Yes,  SPE completed with patient, declined consent for CSW to reach family/friends. ?  ?Has patient been referred to the Quitline?: Patient refused referral ?Tobacco Use: High Risk  ? Smoking Tobacco Use: Every Day  ? Smokeless Tobacco Use: Unknown  ? Passive Exposure: Not on file  ? ? ?Patient has been referred for addiction treatment: Pt. refused referral Patient denies active substance use, UDS positive for cannabis, denies any interest in substance use treatment.  ? ?Corky Crafts, LCSWA ?09/24/2021, 10:24 AM ?

## 2021-09-26 ENCOUNTER — Telehealth: Payer: Self-pay | Admitting: Pharmacy Technician

## 2021-09-26 ENCOUNTER — Other Ambulatory Visit: Payer: Self-pay

## 2021-09-26 NOTE — Telephone Encounter (Signed)
Only signed DOH Attestation.  Approved for [facility] medications. ? ?Sherilyn Dacosta ?Care Manager ?[facility] ?

## 2021-10-22 ENCOUNTER — Other Ambulatory Visit: Payer: Self-pay

## 2021-11-01 ENCOUNTER — Other Ambulatory Visit: Payer: Self-pay

## 2021-11-01 MED ORDER — LITHIUM CARBONATE ER 450 MG PO TBCR
450.0000 mg | EXTENDED_RELEASE_TABLET | Freq: Two times a day (BID) | ORAL | 1 refills | Status: DC
  Filled 2021-11-01: qty 60, 30d supply, fill #0
  Filled 2021-12-02: qty 60, 30d supply, fill #1

## 2021-11-01 MED ORDER — HYDROXYZINE HCL 25 MG PO TABS
25.0000 mg | ORAL_TABLET | Freq: Four times a day (QID) | ORAL | 1 refills | Status: AC | PRN
  Filled 2021-11-01: qty 120, 30d supply, fill #0

## 2021-11-01 MED ORDER — TRAZODONE HCL 50 MG PO TABS
50.0000 mg | ORAL_TABLET | Freq: Every day | ORAL | 1 refills | Status: DC
  Filled 2021-11-01: qty 30, 30d supply, fill #0
  Filled 2021-12-02: qty 30, 30d supply, fill #1

## 2021-12-02 ENCOUNTER — Other Ambulatory Visit: Payer: Self-pay

## 2021-12-03 ENCOUNTER — Other Ambulatory Visit: Payer: Self-pay

## 2021-12-30 ENCOUNTER — Other Ambulatory Visit: Payer: Self-pay

## 2021-12-30 MED ORDER — FANAPT 6 MG PO TABS
ORAL_TABLET | ORAL | 11 refills | Status: DC
Start: 2021-12-30 — End: 2023-08-13
  Filled 2021-12-30: qty 60, 30d supply, fill #0

## 2021-12-30 MED ORDER — TRAZODONE HCL 100 MG PO TABS
ORAL_TABLET | ORAL | 3 refills | Status: DC
  Filled 2021-12-30: qty 30, 30d supply, fill #0
  Filled 2022-01-29: qty 30, 30d supply, fill #1

## 2022-01-16 ENCOUNTER — Other Ambulatory Visit: Payer: Self-pay

## 2022-01-29 ENCOUNTER — Other Ambulatory Visit: Payer: Self-pay

## 2022-02-05 ENCOUNTER — Other Ambulatory Visit: Payer: Self-pay

## 2022-02-05 MED ORDER — OXCARBAZEPINE 300 MG PO TABS
ORAL_TABLET | ORAL | 0 refills | Status: AC
  Filled 2022-02-05: qty 60, 30d supply, fill #0

## 2022-02-21 ENCOUNTER — Other Ambulatory Visit: Payer: Self-pay

## 2022-10-14 ENCOUNTER — Other Ambulatory Visit: Payer: Self-pay

## 2023-07-15 ENCOUNTER — Encounter: Payer: Self-pay | Admitting: *Deleted

## 2023-07-15 ENCOUNTER — Telehealth: Payer: Self-pay | Admitting: *Deleted

## 2023-07-15 NOTE — Telephone Encounter (Signed)
Unable to verify cardiac hx due to no VM setup or answer.

## 2023-07-20 ENCOUNTER — Ambulatory Visit: Payer: Self-pay | Attending: Cardiology | Admitting: Cardiology

## 2023-07-20 ENCOUNTER — Encounter: Payer: Self-pay | Admitting: Cardiology

## 2023-07-20 VITALS — BP 120/78 | HR 90 | Ht 65.0 in | Wt 158.2 lb

## 2023-07-20 DIAGNOSIS — I10 Essential (primary) hypertension: Secondary | ICD-10-CM

## 2023-07-20 DIAGNOSIS — R011 Cardiac murmur, unspecified: Secondary | ICD-10-CM

## 2023-07-20 NOTE — Progress Notes (Signed)
 Cardiology Office Note:    Date:  07/20/2023   ID:  Anne Hendrix, DOB Jan 17, 1981, MRN [id number]  PCP:  [facility]   [facility] Providers Cardiologist:  Debbe Odea, MD     Referring MD: Consuella Lose*   No chief complaint on file.   History of Present Illness:    Anne Hendrix is a 43 y.o. female with a hx of hypertension, former EtOH use, former smoker x 20+ years presenting with a systolic murmur.  Patient states having a murmur almost all her life.  Denies any dizziness, palpitations, chest pain or shortness of breath.  Was previously on blood pressure medications while she was drinking.  Quit drinking a year ago with normalization of blood pressures.  Currently off all BP meds.  Feels well otherwise  Past Medical History:  Diagnosis Date   Herpes    Migraine     Past Surgical History:  Procedure Laterality Date   TUBAL LIGATION     uterine ablation      Current Medications: Current Meds  Medication Sig   hydrOXYzine (ATARAX) 50 MG tablet Take 1 tablet (50 mg total) by mouth every 6 (six) hours as needed for anxiety.   prazosin (MINIPRESS) 1 MG capsule Take 1 mg by mouth at bedtime.   psyllium (HYDROCIL/METAMUCIL) 95 % PACK Take 1 packet by mouth daily.   valACYclovir (VALTREX) 1000 MG tablet Take by mouth.     Allergies:   Patient has no known allergies.   Social History   Socioeconomic History   Marital status: Single    Spouse name: Not on file   Number of children: Not on file   Years of education: Not on file   Highest education level: Not on file  Occupational History   Not on file  Tobacco Use   Smoking status: Former    Current packs/day: 0.00    Types: Cigarettes    Quit date: 01/25/2023    Years since quitting: 0.4   Smokeless tobacco: Not on file  Substance and Sexual Activity   Alcohol use: Not Currently   Drug use: Yes    Types: Marijuana   Sexual activity: Yes  Other Topics Concern    Not on file  Social History Narrative   Not on file   Social Drivers of Health   Financial Resource Strain: Not on file  Food Insecurity: Not on file  Transportation Needs: Not on file  Physical Activity: Not on file  Stress: Not on file  Social Connections: Not on file     Family History: The patient's family history includes Breast cancer in her paternal aunt and paternal grandmother.  ROS:   Please see the history of present illness.     All other systems reviewed and are negative.  EKGs/Labs/Other Studies Reviewed:    The following studies were reviewed today:  EKG Interpretation Date/Time:  Monday July 20 2023 10:21:24 EST Ventricular Rate:  90 PR Interval:  146 QRS Duration:  78 QT Interval:  364 QTC Calculation: 445 R Axis:   -14  Text Interpretation: Normal sinus rhythm Normal ECG Confirmed by Debbe Odea ([id number]) on 07/20/2023 10:33:34 AM    Recent Labs: No results found for requested labs within last 365 days.  Recent Lipid Panel No results found for: "CHOL", "TRIG", "HDL", "CHOLHDL", "VLDL", "LDLCALC", "LDLDIRECT"   Risk Assessment/Calculations:             Physical Exam:  VS:  BP 120/78   Pulse 90   Ht 5\' 5"  (1.651 m)   Wt 158 lb 3.2 oz (71.8 kg)   LMP 06/05/2023   SpO2 97%   BMI 26.33 kg/m     Wt Readings from Last 3 Encounters:  07/20/23 158 lb 3.2 oz (71.8 kg)  09/18/21 120 lb (54.4 kg)  09/15/21 120 lb (54.4 kg)     GEN:  Well nourished, well developed in no acute distress HEENT: Normal NECK: No JVD; No carotid bruits CARDIAC: RRR, 2/6 systolic murmur RESPIRATORY:  Clear to auscultation without rales, wheezing or rhonchi  ABDOMEN: Soft, non-tender, non-distended MUSCULOSKELETAL:  No edema; No deformity  SKIN: Warm and dry NEUROLOGIC:  Alert and oriented x 3 PSYCHIATRIC:  Normal affect   ASSESSMENT:    1. Systolic murmur   2. Primary hypertension    PLAN:    In order of problems listed above:  Systolic  murmur, obtain echocardiogram to rule out any structural abnormalities. Hypertension, BP controlled off antihypertensives.  Continue to monitor.  Follow-up after echo     Medication Adjustments/Labs and Tests Ordered: Current medicines are reviewed at length with the patient today.  Concerns regarding medicines are outlined above.  Orders Placed This Encounter  Procedures   EKG 12-Lead   ECHOCARDIOGRAM COMPLETE   No orders of the defined types were placed in this encounter.   Patient Instructions  Medication Instructions:   Your physician recommends that you continue on your current medications as directed. Please refer to the Current Medication list given to you today.  *If you need a refill on your cardiac medications before your next appointment, please call your pharmacy*   Lab Work:  None Ordered  If you have labs (blood work) drawn today and your tests are completely normal, you will receive your results only by: MyChart Message (if you have MyChart) OR A paper copy in the mail If you have any lab test that is abnormal or we need to change your treatment, we will call you to review the results.   Testing/Procedures:  Your physician has requested that you have an echocardiogram. Echocardiography is a painless test that uses sound waves to create images of your heart. It provides your doctor with information about the size and shape of your heart and how well your heart's chambers and valves are working. This procedure takes approximately one hour. There are no restrictions for this procedure. Please do NOT wear cologne, perfume, aftershave, or lotions (deodorant is allowed). Please arrive 15 minutes prior to your appointment time.  Please note: We ask at that you not bring children with you during ultrasound (echo/ vascular) testing. Due to room size and safety concerns, children are not allowed in the ultrasound rooms during exams. Our front office staff cannot provide  observation of children in our lobby area while testing is being conducted. An adult accompanying a patient to their appointment will only be allowed in the ultrasound room at the discretion of the ultrasound technician under special circumstances. We apologize for any inconvenience.    Follow-Up: At [facility], you and your health needs are our priority.  As part of our continuing mission to provide you with exceptional heart care, we have created designated Provider Care Teams.  These Care Teams include your primary Cardiologist (physician) and Advanced Practice Providers (APPs -  Physician Assistants and Nurse Practitioners) who all work together to provide you with the care you need, when you need it.  We  recommend signing up for the patient portal called "MyChart".  Sign up information is provided on this After Visit Summary.  MyChart is used to connect with patients for Virtual Visits (Telemedicine).  Patients are able to view lab/test results, encounter notes, upcoming appointments, etc.  Non-urgent messages can be sent to your provider as well.   To learn more about what you can do with MyChart, go to [email or web address].    Your next appointment:    After Echocardiogram  Provider:   You may see Debbe Odea, MD or one of the following Advanced Practice Providers on your designated Care Team:   Nicolasa Ducking, NP Eula Listen, PA-C Cadence Fransico Michael, PA-C Charlsie Quest, NP Carlos Levering, NP   Signed, Debbe Odea, MD  07/20/2023 11:44 AM    [facility]

## 2023-07-20 NOTE — Patient Instructions (Signed)
 Medication Instructions:   Your physician recommends that you continue on your current medications as directed. Please refer to the Current Medication list given to you today.  *If you need a refill on your cardiac medications before your next appointment, please call your pharmacy*   Lab Work:  None Ordered  If you have labs (blood work) drawn today and your tests are completely normal, you will receive your results only by: MyChart Message (if you have MyChart) OR A paper copy in the mail If you have any lab test that is abnormal or we need to change your treatment, we will call you to review the results.   Testing/Procedures:  Your physician has requested that you have an echocardiogram. Echocardiography is a painless test that uses sound waves to create images of your heart. It provides your doctor with information about the size and shape of your heart and how well your heart's chambers and valves are working. This procedure takes approximately one hour. There are no restrictions for this procedure. Please do NOT wear cologne, perfume, aftershave, or lotions (deodorant is allowed). Please arrive 15 minutes prior to your appointment time.  Please note: We ask at that you not bring children with you during ultrasound (echo/ vascular) testing. Due to room size and safety concerns, children are not allowed in the ultrasound rooms during exams. Our front office staff cannot provide observation of children in our lobby area while testing is being conducted. An adult accompanying a patient to their appointment will only be allowed in the ultrasound room at the discretion of the ultrasound technician under special circumstances. We apologize for any inconvenience.    Follow-Up: At [facility], you and your health needs are our priority.  As part of our continuing mission to provide you with exceptional heart care, we have created designated Provider Care Teams.  These Care Teams  include your primary Cardiologist (physician) and Advanced Practice Providers (APPs -  Physician Assistants and Nurse Practitioners) who all work together to provide you with the care you need, when you need it.  We recommend signing up for the patient portal called "MyChart".  Sign up information is provided on this After Visit Summary.  MyChart is used to connect with patients for Virtual Visits (Telemedicine).  Patients are able to view lab/test results, encounter notes, upcoming appointments, etc.  Non-urgent messages can be sent to your provider as well.   To learn more about what you can do with MyChart, go to [email or web address].    Your next appointment:    After Echocardiogram  Provider:   You may see Debbe Odea, MD or one of the following Advanced Practice Providers on your designated Care Team:   Nicolasa Ducking, NP Eula Listen, PA-C Cadence Fransico Michael, PA-C Charlsie Quest, NP Carlos Levering, NP

## 2023-08-11 ENCOUNTER — Ambulatory Visit: Payer: Self-pay | Attending: Cardiology

## 2023-08-11 DIAGNOSIS — R011 Cardiac murmur, unspecified: Secondary | ICD-10-CM

## 2023-08-11 LAB — ECHOCARDIOGRAM COMPLETE
AR max vel: 3.29 cm2
AV Area VTI: 3.27 cm2
AV Area mean vel: 3.24 cm2
AV Mean grad: 4 mmHg
AV Peak grad: 7.4 mmHg
Ao pk vel: 1.36 m/s
Area-P 1/2: 5.97 cm2
Calc EF: 57.4 %
Single Plane A2C EF: 57.9 %
Single Plane A4C EF: 55.4 %

## 2023-08-13 ENCOUNTER — Ambulatory Visit: Payer: Self-pay | Attending: Cardiology | Admitting: Cardiology

## 2023-08-13 ENCOUNTER — Encounter: Payer: Self-pay | Admitting: Cardiology

## 2023-08-13 VITALS — BP 100/58 | HR 111 | Ht 65.0 in | Wt 163.8 lb

## 2023-08-13 DIAGNOSIS — R011 Cardiac murmur, unspecified: Secondary | ICD-10-CM

## 2023-08-13 DIAGNOSIS — I10 Essential (primary) hypertension: Secondary | ICD-10-CM

## 2023-08-13 NOTE — Progress Notes (Signed)
 Cardiology Office Note:    Date:  08/13/2023   ID:  Anne Hendrix, DOB [birth year], MRN [id number]  PCP:  [facility], [facility]   [facility] Providers Cardiologist:  Debbe Odea, MD     Referring MD: [facility], [facility]*   No chief complaint on file.   History of Present Illness:    Anne Hendrix is a 43 y.o. female with a hx of hypertension, former EtOH use, former smoker x 20+ years presenting for follow-up.  Recently seen due to a murmur.  Echocardiogram was obtained to evaluate any significant structural abnormalities.  Patient otherwise feels well, denies chest pain or shortness of breath.  Was previously on blood pressure medications, BP currently controlled off any medications. Denies any concerns today.   Past Medical History:  Diagnosis Date   Herpes    Migraine     Past Surgical History:  Procedure Laterality Date   TUBAL LIGATION     uterine ablation      Current Medications: Current Meds  Medication Sig   hydrOXYzine (ATARAX) 25 MG tablet Take 1 tablet (25 mg total) by mouth every 6 (six) hours as needed.   hydrOXYzine (ATARAX) 50 MG tablet Take 1 tablet (50 mg total) by mouth every 6 (six) hours as needed for anxiety.   metFORMIN (GLUCOPHAGE) 500 MG tablet Take 500 mg by mouth daily.   Oxcarbazepine (TRILEPTAL) 300 MG tablet two po qhs   prazosin (MINIPRESS) 1 MG capsule Take 1 mg by mouth at bedtime.   RISPERIDONE PO Take 4 mg by mouth daily.   valACYclovir (VALTREX) 1000 MG tablet Take by mouth.     Allergies:   Patient has no known allergies.   Social History   Socioeconomic History   Marital status: Single    Spouse name: Not on file   Number of children: Not on file   Years of education: Not on file   Highest education level: Not on file  Occupational History   Not on file  Tobacco Use   Smoking status: Every Day    Current packs/day: 0.00    Types: Cigarettes    Last attempt to quit: 01/25/2023    Years  since quitting: 0.5   Smokeless tobacco: Not on file  Substance and Sexual Activity   Alcohol use: Not Currently   Drug use: Yes    Types: Marijuana   Sexual activity: Yes  Other Topics Concern   Not on file  Social History Narrative   Not on file   Social Drivers of Health   Financial Resource Strain: Not on file  Food Insecurity: Not on file  Transportation Needs: Not on file  Physical Activity: Not on file  Stress: Not on file  Social Connections: Not on file     Family History: The patient's family history includes Breast cancer in her paternal aunt and paternal grandmother.  ROS:   Please see the history of present illness.     All other systems reviewed and are negative.  EKGs/Labs/Other Studies Reviewed:    The following studies were reviewed today:       Recent Labs: No results found for requested labs within last 365 days.  Recent Lipid Panel No results found for: "CHOL", "TRIG", "HDL", "CHOLHDL", "VLDL", "LDLCALC", "LDLDIRECT"   Risk Assessment/Calculations:             Physical Exam:    VS:  BP (!) 100/58 (BP Location: Left Arm, Patient Position: Sitting)  Pulse (!) 111   Ht 5\' 5"  (1.651 m)   Wt 163 lb 12.8 oz (74.3 kg)   LMP 06/05/2023   SpO2 98%   BMI 27.26 kg/m     Wt Readings from Last 3 Encounters:  08/13/23 163 lb 12.8 oz (74.3 kg)  07/20/23 158 lb 3.2 oz (71.8 kg)  09/18/21 120 lb (54.4 kg)     GEN:  Well nourished, well developed in no acute distress HEENT: Normal NECK: No JVD; No carotid bruits CARDIAC: RRR, 2/6 systolic murmur RESPIRATORY:  Clear to auscultation without rales, wheezing or rhonchi  ABDOMEN: Soft, non-tender, non-distended MUSCULOSKELETAL:  No edema; No deformity  SKIN: Warm and dry NEUROLOGIC:  Alert and oriented x 3 PSYCHIATRIC:  Normal affect   ASSESSMENT:    1. Systolic murmur   2. Primary hypertension     PLAN:    In order of problems listed above:  Systolic murmur, echo 3/25 EF 60 to 65%,  moderate asymmetric LVH, no LVOT obstruction.  No significant structural abnormalities. Hypertension, BP controlled off antihypertensives.  Continue to monitor.  Follow-up as needed.     Medication Adjustments/Labs and Tests Ordered: Current medicines are reviewed at length with the patient today.  Concerns regarding medicines are outlined above.  No orders of the defined types were placed in this encounter.  No orders of the defined types were placed in this encounter.   Patient Instructions  Medication Instructions:  No changes *If you need a refill on your cardiac medications before your next appointment, please call your pharmacy*  Lab Work: No labs If you have labs (blood work) drawn today and your tests are completely normal, you will receive your results only by: MyChart Message (if you have MyChart) OR A paper copy in the mail If you have any lab test that is abnormal or we need to change your treatment, we will call you to review the results.  Testing/Procedures: No testing  Follow-Up: At [facility], you and your health needs are our priority.  As part of our continuing mission to provide you with exceptional heart care, we have created designated Provider Care Teams.  These Care Teams include your primary Cardiologist (physician) and Advanced Practice Providers (APPs -  Physician Assistants and Nurse Practitioners) who all work together to provide you with the care you need, when you need it.  We recommend signing up for the patient portal called "MyChart".  Sign up information is provided on this After Visit Summary.  MyChart is used to connect with patients for Virtual Visits (Telemedicine).  Patients are able to view lab/test results, encounter notes, upcoming appointments, etc.  Non-urgent messages can be sent to your provider as well.   To learn more about what you can do with MyChart, go to [email or web address].    Your next appointment:   As  needed  Provider:   Debbe Odea, MD      Signed, Debbe Odea, MD  08/13/2023 10:29 AM    [facility]

## 2023-08-13 NOTE — Patient Instructions (Signed)
 Medication Instructions:  No changes *If you need a refill on your cardiac medications before your next appointment, please call your pharmacy*  Lab Work: No labs If you have labs (blood work) drawn today and your tests are completely normal, you will receive your results only by: MyChart Message (if you have MyChart) OR A paper copy in the mail If you have any lab test that is abnormal or we need to change your treatment, we will call you to review the results.  Testing/Procedures: No testing  Follow-Up: At [facility], you and your health needs are our priority.  As part of our continuing mission to provide you with exceptional heart care, we have created designated Provider Care Teams.  These Care Teams include your primary Cardiologist (physician) and Advanced Practice Providers (APPs -  Physician Assistants and Nurse Practitioners) who all work together to provide you with the care you need, when you need it.  We recommend signing up for the patient portal called "MyChart".  Sign up information is provided on this After Visit Summary.  MyChart is used to connect with patients for Virtual Visits (Telemedicine).  Patients are able to view lab/test results, encounter notes, upcoming appointments, etc.  Non-urgent messages can be sent to your provider as well.   To learn more about what you can do with MyChart, go to [email or web address].    Your next appointment:   As needed  Provider:   Debbe Odea, MD

## 2023-11-23 ENCOUNTER — Ambulatory Visit: Payer: Self-pay | Admitting: Family Medicine

## 2023-11-23 ENCOUNTER — Encounter: Payer: Self-pay | Admitting: Family Medicine

## 2023-11-23 DIAGNOSIS — Z113 Encounter for screening for infections with a predominantly sexual mode of transmission: Secondary | ICD-10-CM

## 2023-11-23 DIAGNOSIS — B379 Candidiasis, unspecified: Secondary | ICD-10-CM

## 2023-11-23 LAB — HM HIV SCREENING LAB: HM HIV Screening: NEGATIVE

## 2023-11-23 LAB — WET PREP FOR TRICH, YEAST, CLUE
Clue Cell Exam: POSITIVE — AB
Trichomonas Exam: NEGATIVE

## 2023-11-23 MED ORDER — CLOTRIMAZOLE 1 % VA CREA: 1.0000 | TOPICAL_CREAM | Freq: Every day | VAGINAL | Status: AC

## 2023-11-23 NOTE — Progress Notes (Signed)
 [facility] Department STI clinic [address] [place] [address] Main phone: [phone]  STI screening visit  Subjective:  Anne Hendrix is a 43 y.o. female being seen today for an STI screening visit. The patient reports they do not have symptoms.  Patient reports that they do not desire a pregnancy in the next year.   They reported they are not interested in discussing contraception today.    Patient's last menstrual period was 10/28/2023 (approximate).  Patient has the following medical conditions:  Patient Active Problem List   Diagnosis Date Noted   Bipolar 1 disorder (HCC) 09/19/2021   Cannabis abuse 09/19/2021   Bipolar affective disorder, manic, moderate (HCC) 09/15/2021   Chief Complaint  Patient presents with   SEXUALLY TRANSMITTED DISEASE    HPI Patient reports to clinic for STI testing- asymptomatic  Does the patient using douching products? No  See flowsheet for further details and programmatic requirements Hyperlink available at the top of the signed note in blue.  Flow sheet content below:  Pregnancy Intention Screening Does the patient want to become pregnant in the next year?: No Does the patient's partner want to become pregnant in the next year?: No Would the patient like to discuss contraceptive options today?: No Reason For STD Screen STD Screening: Is asymptomatic Have you ever had an STD?: Yes History of Antibiotic use in the past 2 weeks?: No STD Symptoms Denies all: Yes Risk Factors for Hep B Household, sexual, or needle sharing contact of a person infected with Hep B: No Sexual contact with a person who uses drugs not as prescribed?: No Currently or Ever used drugs not as prescribed: No HIV Positive: No PRep Patient: No Men who have sex with men: N/A Have Hepatitis C: No History of Incarceration: No History of Homeslessness?: No Anal sex following anal drug use?: No Risk Factors for Hep  C Currently using drugs not as prescribed: No Sexual partner(s) currently using drugs as not prescribed: No History of drug use: No HIV Positive: No People with a history of incarceration: No People born between the years of 57 and 32: No Abuse History Has patient ever been abused physically?: No Has patient ever been abused sexually?: No Does patient feel they have a problem with Anxiety?: Yes Does patient feel they have a problem with Depression?: Yes Referral to Behavioral Health: Declined Counseling Patient counseled to use condoms with all sex: Condoms declined RTC in 2-3 weeks for test results: Yes Clinic will call if test results abnormal before test result appt.: Yes Test results given to patient Patient counseled to use condoms with all sex: Condoms declined   Screening for MPX risk: Does the patient have an unexplained rash? No Is the patient MSM? No Does the patient endorse multiple sex partners or anonymous sex partners? No Did the patient have close or sexual contact with a person diagnosed with MPX? No Has the patient traveled outside the [place]  where MPX is endemic? No Is there a high clinical suspicion for MPX-- evidenced by one of the following No  -Unlikely to be chickenpox  -Lymphadenopathy  -Rash that present in same phase of evolution on any given body part  Screenings: Last HIV test per patient/review of record was No results found for: HMHIVSCREEN No results found for: HIV   Last HEPC test per patient/review of record was No results found for: HMHEPCSCREEN No components found for: HEPC   Last HEPB test per patient/review of record was  No components found for: HMHEPBSCREEN   Patient reports last pap was:   No results found for: SPECADGYN No Cervical Cancer Screening results to display.  Immunization history:  Immunization History  Administered Date(s) Administered   Moderna Sars-Covid-2 Vaccination 08/12/2019, 09/13/2019    The  following portions of the patient's history were reviewed and updated as appropriate: allergies, current medications, past medical history, past social history, past surgical history and problem list.  Objective:  There were no vitals filed for this visit.  Physical Exam Vitals and nursing note reviewed. Exam conducted with a chaperone present Brett Orange CNA).  Constitutional:      Appearance: Normal appearance.  HENT:     Head: Normocephalic and atraumatic.     Mouth/Throat:     Mouth: Mucous membranes are moist.     Pharynx: Oropharynx is clear. No oropharyngeal exudate or posterior oropharyngeal erythema.  Pulmonary:     Effort: Pulmonary effort is normal.  Abdominal:     General: Abdomen is flat.     Palpations: There is no mass.     Tenderness: There is no abdominal tenderness. There is no rebound.  Genitourinary:    General: Normal vulva.     Exam position: Lithotomy position.     Pubic Area: No rash or pubic lice.      Labia:        Right: No rash or lesion.        Left: No rash or lesion.      Vagina: Vaginal discharge present. No erythema, bleeding or lesions.     Cervix: No cervical motion tenderness, discharge, friability, lesion or erythema.     Uterus: Normal.      Adnexa: Right adnexa normal and left adnexa normal.     Rectum: Normal.     Comments: pH = 4  Thick white discharge Lymphadenopathy:     Head:     Right side of head: No preauricular or posterior auricular adenopathy.     Left side of head: No preauricular or posterior auricular adenopathy.     Cervical: No cervical adenopathy.     Upper Body:     Right upper body: No supraclavicular, axillary or epitrochlear adenopathy.     Left upper body: No supraclavicular, axillary or epitrochlear adenopathy.     Lower Body: No right inguinal adenopathy. No left inguinal adenopathy.   Skin:    General: Skin is warm and dry.     Findings: No rash.   Neurological:     Mental Status: She is alert and  oriented to person, place, and time.      Assessment and Plan:  Anne Hendrix is a 43 y.o. female presenting to the [facility] Department for STI screening  1. Screening for venereal disease (Primary)  - Chlamydia/Gonorrhea [place] [facility] - HIV [place] [facility] - Syphilis Serology, [place] [facility] - WET PREP FOR TRICH, YEAST, CLUE   Patient accepted the following screenings: vaginal CT/GC swab, vaginal wet prep, HIV, and RPR Patient meets criteria for HepB screening? No. Ordered? not applicable Patient meets criteria for HepC screening? No. Ordered? not applicable  Treat wet prep per standing order Discussed time line for [facility] results and that patient will be called with positive results and encouraged patient to call if she had not heard in 2 weeks.  Counseled to return or seek care for continued or worsening symptoms Recommended repeat testing in 3 months with positive results. Recommended condom use with  all sex for STI prevention.   Patient is currently using nothing to prevent pregnancy.    Return if symptoms worsen or fail to improve, for STI screening.  No future appointments.  Verneta Bers, [place]

## 2023-11-23 NOTE — Addendum Note (Signed)
 Addended by: DINEEN LARRAINE SAUNDERS on: 11/23/2023 03:34 PM   Modules accepted: Orders

## 2023-11-23 NOTE — Progress Notes (Signed)
 Pt is here for std screening, wet prep results reviewed with pt treatment required per standing order.  The patient was dispensed clotrimazole vaginal today. I provided counseling today regarding the medication. We discussed the medication, the side effects and when to call clinic. Patient given the opportunity to ask questions. Questions answered.  Larraine JONELLE [place], RN

## 2023-12-10 ENCOUNTER — Encounter: Payer: Self-pay | Admitting: Family Medicine
